# Patient Record
Sex: Female | Born: 1995 | Race: White | Hispanic: No | Marital: Single | Smoking: Never smoker
Health system: Southern US, Community
[De-identification: ages and names within clinical notes are randomized; demographics above are authoritative.]

## PROBLEM LIST (undated history)

## (undated) DIAGNOSIS — F3281 Premenstrual dysphoric disorder: Secondary | ICD-10-CM

## (undated) DIAGNOSIS — E282 Polycystic ovarian syndrome: Secondary | ICD-10-CM

## (undated) DIAGNOSIS — B3731 Acute candidiasis of vulva and vagina: Secondary | ICD-10-CM

## (undated) DIAGNOSIS — B9689 Other specified bacterial agents as the cause of diseases classified elsewhere: Secondary | ICD-10-CM

## (undated) DIAGNOSIS — N760 Acute vaginitis: Secondary | ICD-10-CM

## (undated) DIAGNOSIS — N912 Amenorrhea, unspecified: Secondary | ICD-10-CM

## (undated) DIAGNOSIS — R5383 Other fatigue: Secondary | ICD-10-CM

## (undated) DIAGNOSIS — E221 Hyperprolactinemia: Secondary | ICD-10-CM

## (undated) DIAGNOSIS — R011 Cardiac murmur, unspecified: Secondary | ICD-10-CM

## (undated) DIAGNOSIS — J45909 Unspecified asthma, uncomplicated: Secondary | ICD-10-CM

## (undated) HISTORY — PX: ESOPHAGOGASTRODUODENOSCOPY ENDOSCOPY: SHX5814

## (undated) HISTORY — PX: WISDOM TOOTH EXTRACTION: SHX21

## (undated) HISTORY — PX: TONSILLECTOMY: SHX5217

## (undated) HISTORY — DX: Unspecified asthma, uncomplicated: J45.909

## (undated) HISTORY — DX: Cardiac murmur, unspecified: R01.1

---
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
## 2016-02-28 ENCOUNTER — Other Ambulatory Visit: Payer: Self-pay | Admitting: Family Medicine

## 2016-02-28 MED ORDER — ALBUTEROL SULFATE HFA 108 (90 BASE) MCG/ACT IN AERS: 2.0000 | INHALATION_SPRAY | Freq: Four times a day (QID) | RESPIRATORY_TRACT | Status: DC | PRN

## 2016-02-28 NOTE — Telephone Encounter (Signed)
Pt's mother called stated pt needs a new rescue inhaler (albuterol). Pharm is [facility] in [place]. Thanks.

## 2016-02-28 NOTE — Telephone Encounter (Signed)
Routing to provider  

## 2016-02-28 NOTE — Telephone Encounter (Signed)
Rx sent to her pharmacy 

## 2016-03-12 ENCOUNTER — Emergency Department
Admission: EM | Admit: 2016-03-12 | Discharge: 2016-03-12 | Disposition: A | Discharge status: Home / Self Care | Payer: BLUE CROSS/BLUE SHIELD | Attending: Emergency Medicine | Admitting: Emergency Medicine

## 2016-03-12 ENCOUNTER — Encounter: Payer: Self-pay | Admitting: Emergency Medicine

## 2016-03-12 DIAGNOSIS — Z5321 Procedure and treatment not carried out due to patient leaving prior to being seen by health care provider: Secondary | ICD-10-CM | POA: Insufficient documentation

## 2016-03-12 DIAGNOSIS — R55 Syncope and collapse: Secondary | ICD-10-CM | POA: Insufficient documentation

## 2016-03-12 LAB — BASIC METABOLIC PANEL
ANION GAP: 6 (ref 5–15)
BUN: 14 mg/dL (ref 6–20)
CALCIUM: 9.1 mg/dL (ref 8.9–10.3)
CO2: 26 mmol/L (ref 22–32)
CREATININE: 0.79 mg/dL (ref 0.44–1.00)
Chloride: 106 mmol/L (ref 101–111)
Glucose, Bld: 77 mg/dL (ref 65–99)
Potassium: 4 mmol/L (ref 3.5–5.1)
SODIUM: 138 mmol/L (ref 135–145)

## 2016-03-12 LAB — CBC
HCT: 34.8 % — ABNORMAL LOW (ref 35.0–47.0)
HEMOGLOBIN: 12 g/dL (ref 12.0–16.0)
MCH: 29.4 pg (ref 26.0–34.0)
MCHC: 34.6 g/dL (ref 32.0–36.0)
MCV: 85 fL (ref 80.0–100.0)
PLATELETS: 161 10*3/uL (ref 150–440)
RBC: 4.09 MIL/uL (ref 3.80–5.20)
RDW: 15 % — ABNORMAL HIGH (ref 11.5–14.5)
WBC: 5.5 10*3/uL (ref 3.6–11.0)

## 2016-03-12 NOTE — ED Notes (Signed)
States was outside gardening this morning and felt light headed.  States went inside and felt as if she was going to pass out and had some chest tightness and wheezing.  Also c/o left arm numbness.

## 2016-03-16 ENCOUNTER — Encounter: Payer: Self-pay | Admitting: Family Medicine

## 2016-03-16 ENCOUNTER — Ambulatory Visit (INDEPENDENT_AMBULATORY_CARE_PROVIDER_SITE_OTHER): Payer: BLUE CROSS/BLUE SHIELD | Admitting: Family Medicine

## 2016-03-16 VITALS — BP 115/77 | HR 75 | Temp 98.2°F | Wt 157.0 lb

## 2016-03-16 DIAGNOSIS — J4531 Mild persistent asthma with (acute) exacerbation: Secondary | ICD-10-CM | POA: Diagnosis not present

## 2016-03-16 MED ORDER — LORATADINE 10 MG PO TABS: 10.0000 mg | ORAL_TABLET | Freq: Every day | ORAL | 11 refills | Status: DC

## 2016-03-16 MED ORDER — BUDESONIDE-FORMOTEROL FUMARATE 160-4.5 MCG/ACT IN AERO: 2.0000 | INHALATION_SPRAY | Freq: Two times a day (BID) | RESPIRATORY_TRACT | 3 refills | Status: DC

## 2016-03-16 NOTE — Progress Notes (Signed)
BP 115/77   Pulse 75   Temp 98.2 F (36.8 C)   Wt 157 lb (71.2 kg)   LMP 03/01/2016 (Exact Date)   SpO2 99%   BMI 24.59 kg/m    Subjective:    Patient ID: Jessica Harris, female    DOB: 1995-09-02, 20 y.o.   MRN: [id number]  HPI: Jessica Harris is a 20 y.o. female  Chief Complaint  Patient presents with  . URI    x few days; some chest tightness and SOB, cough. She did go to the ED for the chest pain/tightness. Work up was normal.   . Asthma    few days ago she had a really bad asthma attack.    20 year old female with hx of asthma presents following an ER visit for chest tightness, wheezing, and SOB that occurred while gardening a few days ago. Had some left arm numbness/tingling during episode, which alarmed her and she went to ER. Began to feel better before seeing provider, so left shortly after having a few labs drawn. States using her albuterol inhaler seemed to resolve symptoms.   Asthma worsening over the past year. Used to only have to use inhaler a few times per year, now having SOB almost daily the past few months. Worst in springtime. Takes claritin occasionally for allergy sxs.    Relevant past medical, surgical, family and social history reviewed and updated as indicated. Interim medical history since our last visit reviewed. Allergies and medications reviewed and updated.  Review of Systems  Constitutional: Negative.   HENT: Negative.   Respiratory: Positive for chest tightness, shortness of breath and wheezing.   Gastrointestinal: Negative.   Genitourinary: Negative.   Musculoskeletal: Negative.   Neurological: Positive for numbness.  Psychiatric/Behavioral: Negative.     Per HPI unless specifically indicated above     Objective:    BP 115/77   Pulse 75   Temp 98.2 F (36.8 C)   Wt 157 lb (71.2 kg)   LMP 03/01/2016 (Exact Date)   SpO2 99%   BMI 24.59 kg/m   Wt Readings from Last 3 Encounters:  03/16/16 157 lb (71.2 kg) (87 %, Z= 1.11)*    03/12/16 155 lb (70.3 kg) (86 %, Z= 1.06)*  05/27/15 160 lb (72.6 kg) (90 %, Z= 1.26)*   * Growth percentiles are based on CDC 2-20 Years data.    Physical Exam  Constitutional: She is oriented to person, place, and time. She appears well-developed and well-nourished.  HENT:  Head: Atraumatic.  Mouth/Throat: Oropharynx is clear and moist.  Eyes: Conjunctivae are normal. No scleral icterus.  Neck: Normal range of motion. Neck supple.  Cardiovascular: Normal rate.   Murmur heard. Pulmonary/Chest: Effort normal and breath sounds normal. She has no wheezes.  Musculoskeletal: Normal range of motion.  Lymphadenopathy:    She has no cervical adenopathy.  Neurological: She is alert and oriented to person, place, and time.  Skin: Skin is warm and dry.  Psychiatric: She has a normal mood and affect. Her behavior is normal.  Nursing note and vitals reviewed.   Results for orders placed or performed during the hospital encounter of 03/12/16  Basic metabolic panel  Result Value Ref Range   Sodium 138 135 - 145 mmol/L   Potassium 4.0 3.5 - 5.1 mmol/L   Chloride 106 101 - 111 mmol/L   CO2 26 22 - 32 mmol/L   Glucose, Bld 77 65 - 99 mg/dL   BUN 14 6 - 20  mg/dL   Creatinine, Ser 1.61 0.44 - 1.00 mg/dL   Calcium 9.1 8.9 - 09.6 mg/dL   GFR calc non Af Amer >60 >60 mL/min   GFR calc Af Amer >60 >60 mL/min   Anion gap 6 5 - 15  CBC  Result Value Ref Range   WBC 5.5 3.6 - 11.0 K/uL   RBC 4.09 3.80 - 5.20 MIL/uL   Hemoglobin 12.0 12.0 - 16.0 g/dL   HCT 04.5 (L) 40.9 - 81.1 %   MCV 85.0 80.0 - 100.0 fL   MCH 29.4 26.0 - 34.0 pg   MCHC 34.6 32.0 - 36.0 g/dL   RDW 91.4 (H) 78.2 - 95.6 %   Platelets 161 150 - 440 K/uL      Assessment & Plan:   Problem List Items Addressed This Visit    None    Visit Diagnoses    Asthma, mild persistent, with acute exacerbation    -  Primary   Relevant Medications   budesonide-formoterol (SYMBICORT) 160-4.5 MCG/ACT inhaler     Will add symbicort  daily to her regimen as her symptoms are now persistent. Will watch for weight gain, as patient states long ago while on flovent that she had a fair amount of weight gain. Discussed swishing water after use of inhaler. Continue albuterol inhaler as needed.   Discussed taking claritin daily rather than on occasion, especially during spring. Rx sent for generic as they were concerned with OTC cost.    Follow up plan: Return if symptoms worsen or fail to improve.

## 2016-03-16 NOTE — Patient Instructions (Signed)
Follow up as needed

## 2016-03-24 NOTE — ED Provider Notes (Signed)
Time Seen: Approximately 12:30 I have reviewed the triage notes  Chief Complaint: Near Syncope   History of Present Illness: Jessica Harris is a 20 y.o. female *who states she was working outside when she felt very lightheaded and had some mild chest discomfort. She denies any syncopal episode. Patient denies any nausea, vomiting, shortness of breath and feels symptomatically improved at this time. She has history of a heart murmur and asthma   Past Medical History:  Diagnosis Date  . Asthma   . Heart murmur     Patient Active Problem List   Diagnosis Date Noted  . History of asthma 03/01/2015  . Anemia 02/08/2015  . Heart murmur 06/04/2013  . Chest wall pain 06/04/2013    Past Surgical History:  Procedure Laterality Date  . ESOPHAGOGASTRODUODENOSCOPY ENDOSCOPY     2x normal after peristent vomiting  . TONSILLECTOMY      Past Surgical History:  Procedure Laterality Date  . ESOPHAGOGASTRODUODENOSCOPY ENDOSCOPY     2x normal after peristent vomiting  . TONSILLECTOMY      Current Outpatient Rx  . Order #: [id number] Class: Normal  . Order #: [id number] Class: Normal  . Order #: [id number] Class: Normal  . Order #: [id number] Class: Normal  . Order #: [id number] Class: Historical Med  . Order #: [id number] Class: Normal  . Order #: [id number] Class: Normal  . Order #: [id number] Class: Normal  . Order #: [id number] Class: Historical Med    Allergies:  Review of patient's allergies indicates no known allergies.  Family History: Family History  Problem Relation Age of Onset  . Diabetes Mother     type 1  . Hematuria Mother   . Esophagitis Brother     eosinophilic esophagitis  . Cancer Maternal Grandmother     breast  . Diabetes Maternal Grandmother   . Addison's disease Maternal Grandmother   . Fibromyalgia Maternal Grandmother   . Heart disease Maternal Grandmother   . Stroke Maternal Grandmother   . Heart attack Maternal Grandfather   . Cancer Maternal Grandfather    prostate  . Hematuria Maternal Grandfather   . Cancer Paternal Grandmother     skin  . Heart attack Paternal Grandfather     Social History: Social History  Substance Use Topics  . Smoking status: Never Smoker  . Smokeless tobacco: Never Used  . Alcohol use No     Review of Systems:   10 point review of systems was performed and was otherwise negative:  Constitutional: No fever Eyes: No visual disturbances ENT: No sore throat, ear pain Cardiac: No current chest pain Respiratory: No shortness of breath, wheezing, or stridor Abdomen: No abdominal pain, no vomiting, No diarrhea Endocrine: No weight loss, No night sweats Extremities: No peripheral edema, cyanosis Skin: No rashes, easy bruising Neurologic: No focal weakness, trouble with speech or swollowing Urologic: No dysuria, Hematuria, or urinary frequency   Physical Exam:  ED Triage Vitals  Enc Vitals Group     BP 03/12/16 1152 116/72     Pulse Rate 03/12/16 1152 89     Resp 03/12/16 1152 18     Temp 03/12/16 1152 97.9 F (36.6 C)     Temp src --      SpO2 03/12/16 1152 100 %     Weight 03/12/16 1152 155 lb (70.3 kg)     Height 03/12/16 1152 5\' 7"  (1.702 m)     Head Circumference --      Peak Flow --      Pain Score 03/12/16 1153  4     Pain Loc --      Pain Edu? --      Excl. in GC? --     General: Awake , Alert , and Oriented times 3; GCS 15 Head: Normal cephalic , atraumatic Eyes: Pupils equal , round, reactive to light Nose/Throat: No nasal drainage, patent upper airway without erythema or exudate.  Neck: Supple, Full range of motion, No anterior adenopathy or palpable thyroid masses Lungs: Clear to ascultation without wheezes , rhonchi, or rales Heart: Regular rate, regular rhythm without murmurs , gallops , or rubs Abdomen: Soft, non tender without rebound, guarding , or rigidity; bowel sounds positive and symmetric in all 4 quadrants. No organomegaly .        Extremities: 2 plus symmetric pulses.  No edema, clubbing or cyanosis Neurologic: normal ambulation, Motor symmetric without deficits, sensory intact Skin: warm, dry, no rashes   Labs:   All laboratory work was reviewed including any pertinent negatives or positives listed below:  Labs Reviewed  CBC - Abnormal; Notable for the following:       Result Value   HCT 34.8 (*)    RDW 15.0 (*)    All other components within normal limits  BASIC METABOLIC PANEL  Laboratory work was reviewed and showed no clinically significant abnormalities.   EKG:  ED ECG REPORT I, Jennye Moccasin, the attending physician, personally viewed and interpreted this ECG.  Date: 03/24/2016 EKG Time: 1140 Rate: 60 Rhythm: normal sinus rhythm QRS Axis: normal Intervals: normal ST/T Wave abnormalities: normal Conduction Disturbances: none Narrative Interpretation: unremarkable Normal    ED Course:  Patient's stay here was uneventful she had some IV fluids etc. I felt this was unlikely to be a life-threatening cause for chest pain the patient was discharged in stable improved condition  Clinical Course     Assessment:  Near syncope     Plan:  Outpatient management Patient was advised to return immediately if condition worsens. Patient was advised to follow up with their primary care physician or other specialized physicians involved in their outpatient care. The patient and/or family member/power of attorney had laboratory results reviewed at the bedside. All questions and concerns were addressed and appropriate discharge instructions were distributed by the nursing staff.             Jennye Moccasin, MD 03/24/16 [phone]

## 2016-05-31 DIAGNOSIS — J029 Acute pharyngitis, unspecified: Secondary | ICD-10-CM | POA: Diagnosis not present

## 2016-05-31 DIAGNOSIS — J3089 Other allergic rhinitis: Secondary | ICD-10-CM | POA: Diagnosis not present

## 2016-06-01 ENCOUNTER — Other Ambulatory Visit: Payer: Self-pay

## 2016-06-01 ENCOUNTER — Encounter: Payer: Self-pay | Admitting: Family Medicine

## 2016-06-01 ENCOUNTER — Ambulatory Visit (INDEPENDENT_AMBULATORY_CARE_PROVIDER_SITE_OTHER): Payer: BLUE CROSS/BLUE SHIELD | Admitting: Family Medicine

## 2016-06-01 VITALS — BP 104/74 | HR 92 | Temp 99.3°F | Wt 158.0 lb

## 2016-06-01 DIAGNOSIS — B9789 Other viral agents as the cause of diseases classified elsewhere: Secondary | ICD-10-CM | POA: Diagnosis not present

## 2016-06-01 DIAGNOSIS — J069 Acute upper respiratory infection, unspecified: Secondary | ICD-10-CM

## 2016-06-01 MED ORDER — LIDOCAINE VISCOUS 2 % MT SOLN: 5.0000 mL | OROMUCOSAL | 0 refills | Status: DC | PRN

## 2016-06-01 MED ORDER — FLUTICASONE PROPIONATE 50 MCG/ACT NA SUSP: 2.0000 | Freq: Every day | NASAL | 6 refills | Status: DC

## 2016-06-01 NOTE — Progress Notes (Signed)
BP 104/74   Pulse 92   Temp 99.3 F (37.4 C)   Wt 158 lb (71.7 kg)   LMP 05/13/2016 (Exact Date)   SpO2 100%   BMI 24.75 kg/m    Subjective:    Patient ID: Jessica Harris, female    DOB: [birth year], 20 y.o.   MRN: [id number]  HPI: Jessica Harris is a 20 y.o. female  Chief Complaint  Patient presents with  . URI    x 3 days. Sore Throat. Congestion, runny nose, fever, ear pain. No cough. Went to urgent care yesterday and had rapid strep test, was negative. Woke up this morning feeling worse.   Patient presents with 3 day history of sore throat, nasal congestion, and right ear pain. Started running low grade fever this morning. Went to UC yesterday and was tested for strep, which was negative. Took some tylenol about 9 am this morning. Taking benadryl and claritin with no relief. Her younger brother has a rare condition where he is immune compromised so she is concerned about what is causing her symptoms.   Relevant past medical, surgical, family and social history reviewed and updated as indicated. Interim medical history since our last visit reviewed. Allergies and medications reviewed and updated.  Review of Systems  Constitutional: Positive for fever.  HENT: Positive for congestion, ear pain, postnasal drip, rhinorrhea and sore throat.   Eyes: Negative.   Respiratory: Negative.   Cardiovascular: Negative.   Gastrointestinal: Negative.   Genitourinary: Negative.   Musculoskeletal: Negative.   Skin: Negative.   Neurological: Negative.   Psychiatric/Behavioral: Negative.     Per HPI unless specifically indicated above     Objective:    BP 104/74   Pulse 92   Temp 99.3 F (37.4 C)   Wt 158 lb (71.7 kg)   LMP 05/13/2016 (Exact Date)   SpO2 100%   BMI 24.75 kg/m   Wt Readings from Last 3 Encounters:  06/01/16 158 lb (71.7 kg) (87 %, Z= 1.12)*  03/16/16 157 lb (71.2 kg) (87 %, Z= 1.11)*  03/12/16 155 lb (70.3 kg) (86 %, Z= 1.06)*   * Growth percentiles are based  on CDC 2-20 Years data.    Physical Exam  Constitutional: She is oriented to person, place, and time. She appears well-developed and well-nourished. No distress.  HENT:  Head: Atraumatic.  Right Ear: External ear normal.  Left Ear: External ear normal.  Nose: Nose normal.  Mouth/Throat: Oropharynx is clear and moist. No oropharyngeal exudate.  Eyes: Conjunctivae are normal. Pupils are equal, round, and reactive to light. No scleral icterus.  Neck: Normal range of motion. Neck supple.  Cardiovascular: Normal rate.   Pulmonary/Chest: Effort normal and breath sounds normal. No respiratory distress. She has no wheezes. She has no rales.  Musculoskeletal: Normal range of motion.  Lymphadenopathy:    She has no cervical adenopathy.  Neurological: She is alert and oriented to person, place, and time.  Skin: Skin is warm and dry. No rash noted. No erythema.  Psychiatric: She has a normal mood and affect. Her behavior is normal.  Nursing note and vitals reviewed.     Assessment & Plan:   Problem List Items Addressed This Visit    None    Visit Diagnoses    Viral upper respiratory tract infection    -  Primary   Viscous lidocaine and flonase sent to help treat symptoms. Discussed good hand hygeine and separation to help protect little brother. Follow up if no  resolution    Recommended salt water gargles, warm tea and honey, continued tylenol or ibuprofen for pain and fever. Good hydration, rest. Work note given   Follow up plan: Return if symptoms worsen or fail to improve.

## 2016-06-01 NOTE — Patient Instructions (Signed)
Follow up as needed. Can take mucinex DM to help with congestion symptoms.

## 2016-06-03 ENCOUNTER — Other Ambulatory Visit: Payer: Self-pay | Admitting: Family Medicine

## 2016-06-03 ENCOUNTER — Telehealth: Payer: Self-pay | Admitting: Family Medicine

## 2016-06-03 MED ORDER — BENZONATATE 200 MG PO CAPS
200.0000 mg | ORAL_CAPSULE | Freq: Two times a day (BID) | ORAL | 0 refills | Status: DC | PRN
Start: 2016-06-03 — End: 2016-12-04

## 2016-06-03 MED ORDER — HYDROCOD POLST-CPM POLST ER 10-8 MG/5ML PO SUER: 5.0000 mL | Freq: Two times a day (BID) | ORAL | 0 refills | Status: DC | PRN

## 2016-06-03 NOTE — Telephone Encounter (Signed)
Routing to provider  

## 2016-06-03 NOTE — Telephone Encounter (Signed)
Pts mom called and stated that the pt was doing a lot of coughing and would like to have something called in to [facility].

## 2016-06-03 NOTE — Telephone Encounter (Signed)
Tessalon perles sent to [facility]

## 2016-08-07 ENCOUNTER — Other Ambulatory Visit: Payer: Self-pay | Admitting: Family Medicine

## 2016-08-19 ENCOUNTER — Other Ambulatory Visit: Payer: Self-pay | Admitting: Family Medicine

## 2016-08-19 NOTE — Telephone Encounter (Signed)
Last refilled on 07/16/15

## 2016-12-04 ENCOUNTER — Encounter: Payer: Self-pay | Admitting: Family Medicine

## 2016-12-04 ENCOUNTER — Ambulatory Visit (INDEPENDENT_AMBULATORY_CARE_PROVIDER_SITE_OTHER): Payer: BLUE CROSS/BLUE SHIELD | Admitting: Family Medicine

## 2016-12-04 VITALS — BP 129/79 | HR 91 | Temp 98.3°F | Ht 66.1 in | Wt 159.5 lb

## 2016-12-04 DIAGNOSIS — Z0000 Encounter for general adult medical examination without abnormal findings: Secondary | ICD-10-CM

## 2016-12-04 LAB — MICROSCOPIC EXAMINATION
Bacteria, UA: NONE SEEN
RBC MICROSCOPIC, UA: NONE SEEN /HPF (ref 0–?)

## 2016-12-04 LAB — UA/M W/RFLX CULTURE, ROUTINE
Bilirubin, UA: NEGATIVE
GLUCOSE, UA: NEGATIVE
KETONES UA: NEGATIVE
NITRITE UA: NEGATIVE
Protein, UA: NEGATIVE
RBC UA: NEGATIVE
SPEC GRAV UA: 1.015 (ref 1.005–1.030)
UUROB: 0.2 mg/dL (ref 0.2–1.0)
pH, UA: 6.5 (ref 5.0–7.5)

## 2016-12-04 NOTE — Patient Instructions (Addendum)
Health Maintenance, Female Adopting a healthy lifestyle and getting preventive care can go a long way to promote health and wellness. Talk with your health care provider about what schedule of regular examinations is right for you. This is a good chance for you to check in with your provider about disease prevention and staying healthy. In between checkups, there are plenty of things you can do on your own. Experts have done a lot of research about which lifestyle changes and preventive measures are most likely to keep you healthy. Ask your health care provider for more information. Weight and diet Eat a healthy diet  Be sure to include plenty of vegetables, fruits, low-fat dairy products, and lean protein.  Do not eat a lot of foods high in solid fats, added sugars, or salt.  Get regular exercise. This is one of the most important things you can do for your health.  Most adults should exercise for at least 150 minutes each week. The exercise should increase your heart rate and make you sweat (moderate-intensity exercise).  Most adults should also do strengthening exercises at least twice a week. This is in addition to the moderate-intensity exercise. Maintain a healthy weight  Body mass index (BMI) is a measurement that can be used to identify possible weight problems. It estimates body fat based on height and weight. Your health care provider can help determine your BMI and help you achieve or maintain a healthy weight.  For females 76 years of age and older:  A BMI below 18.5 is considered underweight.  A BMI of 18.5 to 24.9 is normal.  A BMI of 25 to 29.9 is considered overweight.  A BMI of 30 and above is considered obese. Watch levels of cholesterol and blood lipids  You should start having your blood tested for lipids and cholesterol at 20 years of age, then have this test every 5 years.  You may need to have your cholesterol levels checked more often if:  Your lipid or  cholesterol levels are high.  You are older than 20 years of age.  You are at high risk for heart disease. Cancer screening Lung Cancer  Lung cancer screening is recommended for adults 64-42 years old who are at high risk for lung cancer because of a history of smoking.  A yearly low-dose CT scan of the lungs is recommended for people who:  Currently smoke.  Have quit within the past 15 years.  Have at least a 30-pack-year history of smoking. A pack year is smoking an average of one pack of cigarettes a day for 1 year.  Yearly screening should continue until it has been 15 years since you quit.  Yearly screening should stop if you develop a health problem that would prevent you from having lung cancer treatment. Breast Cancer  Practice breast self-awareness. This means understanding how your breasts normally appear and feel.  It also means doing regular breast self-exams. Let your health care provider know about any changes, no matter how small.  If you are in your 20s or 30s, you should have a clinical breast exam (CBE) by a health care provider every 1-3 years as part of a regular health exam.  If you are 34 or older, have a CBE every year. Also consider having a breast X-ray (mammogram) every year.  If you have a family history of breast cancer, talk to your health care provider about genetic screening.  If you are at high risk for breast cancer, talk  to your health care provider about having an MRI and a mammogram every year.  Breast cancer gene (BRCA) assessment is recommended for women who have family members with BRCA-related cancers. BRCA-related cancers include:  Breast.  Ovarian.  Tubal.  Peritoneal cancers.  Results of the assessment will determine the need for genetic counseling and BRCA1 and BRCA2 testing. Cervical Cancer  Your health care provider may recommend that you be screened regularly for cancer of the pelvic organs (ovaries, uterus, and vagina).  This screening involves a pelvic examination, including checking for microscopic changes to the surface of your cervix (Pap test). You may be encouraged to have this screening done every 3 years, beginning at age 24.  For women ages 66-65, health care providers may recommend pelvic exams and Pap testing every 3 years, or they may recommend the Pap and pelvic exam, combined with testing for human papilloma virus (HPV), every 5 years. Some types of HPV increase your risk of cervical cancer. Testing for HPV may also be done on women of any age with unclear Pap test results.  Other health care providers may not recommend any screening for nonpregnant women who are considered low risk for pelvic cancer and who do not have symptoms. Ask your health care provider if a screening pelvic exam is right for you.  If you have had past treatment for cervical cancer or a condition that could lead to cancer, you need Pap tests and screening for cancer for at least 20 years after your treatment. If Pap tests have been discontinued, your risk factors (such as having a new sexual partner) need to be reassessed to determine if screening should resume. Some women have medical problems that increase the chance of getting cervical cancer. In these cases, your health care provider may recommend more frequent screening and Pap tests. Colorectal Cancer  This type of cancer can be detected and often prevented.  Routine colorectal cancer screening usually begins at 20 years of age and continues through 20 years of age.  Your health care provider may recommend screening at an earlier age if you have risk factors for colon cancer.  Your health care provider may also recommend using home test kits to check for hidden blood in the stool.  A small camera at the end of a tube can be used to examine your colon directly (sigmoidoscopy or colonoscopy). This is done to check for the earliest forms of colorectal cancer.  Routine  screening usually begins at age 41.  Direct examination of the colon should be repeated every 5-10 years through 20 years of age. However, you may need to be screened more often if early forms of precancerous polyps or small growths are found. Skin Cancer  Check your skin from head to toe regularly.  Tell your health care provider about any new moles or changes in moles, especially if there is a change in a mole's shape or color.  Also tell your health care provider if you have a mole that is larger than the size of a pencil eraser.  Always use sunscreen. Apply sunscreen liberally and repeatedly throughout the day.  Protect yourself by wearing long sleeves, pants, a wide-brimmed hat, and sunglasses whenever you are outside. Heart disease, diabetes, and high blood pressure  High blood pressure causes heart disease and increases the risk of stroke. High blood pressure is more likely to develop in:  People who have blood pressure in the high end of the normal range (130-139/85-89 mm Hg).  People who are overweight or obese.  People who are African American.  If you are 59-24 years of age, have your blood pressure checked every 3-5 years. If you are 34 years of age or older, have your blood pressure checked every year. You should have your blood pressure measured twice-once when you are at a hospital or clinic, and once when you are not at a hospital or clinic. Record the average of the two measurements. To check your blood pressure when you are not at a hospital or clinic, you can use:  An automated blood pressure machine at a pharmacy.  A home blood pressure monitor.  If you are between 29 years and 60 years old, ask your health care provider if you should take aspirin to prevent strokes.  Have regular diabetes screenings. This involves taking a blood sample to check your fasting blood sugar level.  If you are at a normal weight and have a low risk for diabetes, have this test once  every three years after 20 years of age.  If you are overweight and have a high risk for diabetes, consider being tested at a younger age or more often. Preventing infection Hepatitis B  If you have a higher risk for hepatitis B, you should be screened for this virus. You are considered at high risk for hepatitis B if:  You were born in a country where hepatitis B is common. Ask your health care provider which countries are considered high risk.  Your parents were born in a high-risk country, and you have not been immunized against hepatitis B (hepatitis B vaccine).  You have HIV or AIDS.  You use needles to inject street drugs.  You live with someone who has hepatitis B.  You have had sex with someone who has hepatitis B.  You get hemodialysis treatment.  You take certain medicines for conditions, including cancer, organ transplantation, and autoimmune conditions. Hepatitis C  Blood testing is recommended for:  Everyone born from [birth year] through 1964.  Anyone with known risk factors for hepatitis C. Sexually transmitted infections (STIs)  You should be screened for sexually transmitted infections (STIs) including gonorrhea and chlamydia if:  You are sexually active and are younger than 20 years of age.  You are older than 20 years of age and your health care provider tells you that you are at risk for this type of infection.  Your sexual activity has changed since you were last screened and you are at an increased risk for chlamydia or gonorrhea. Ask your health care provider if you are at risk.  If you do not have HIV, but are at risk, it may be recommended that you take a prescription medicine daily to prevent HIV infection. This is called pre-exposure prophylaxis (PrEP). You are considered at risk if:  You are sexually active and do not regularly use condoms or know the HIV status of your partner(s).  You take drugs by injection.  You are sexually active with a partner  who has HIV. Talk with your health care provider about whether you are at high risk of being infected with HIV. If you choose to begin PrEP, you should first be tested for HIV. You should then be tested every 3 months for as long as you are taking PrEP. Pregnancy  If you are premenopausal and you may become pregnant, ask your health care provider about preconception counseling.  If you may become pregnant, take 400 to 800 micrograms (mcg) of folic acid  every day.  If you want to prevent pregnancy, talk to your health care provider about birth control (contraception). Osteoporosis and menopause  Osteoporosis is a disease in which the bones lose minerals and strength with aging. This can result in serious bone fractures. Your risk for osteoporosis can be identified using a bone density scan.  If you are 4 years of age or older, or if you are at risk for osteoporosis and fractures, ask your health care provider if you should be screened.  Ask your health care provider whether you should take a calcium or vitamin D supplement to lower your risk for osteoporosis.  Menopause may have certain physical symptoms and risks.  Hormone replacement therapy may reduce some of these symptoms and risks. Talk to your health care provider about whether hormone replacement therapy is right for you. Follow these instructions at home:  Schedule regular health, dental, and eye exams.  Stay current with your immunizations.  Do not use any tobacco products including cigarettes, chewing tobacco, or electronic cigarettes.  If you are pregnant, do not drink alcohol.  If you are breastfeeding, limit how much and how often you drink alcohol.  Limit alcohol intake to no more than 1 drink per day for nonpregnant women. One drink equals 12 ounces of beer, 5 ounces of wine, or 1 ounces of hard liquor.  Do not use street drugs.  Do not share needles.  Ask your health care provider for help if you need support  or information about quitting drugs.  Tell your health care provider if you often feel depressed.  Tell your health care provider if you have ever been abused or do not feel safe at home. This information is not intended to replace advice given to you by your health care provider. Make sure you discuss any questions you have with your health care provider. Document Released: 02/23/2011 Document Revised: 01/16/2016 Document Reviewed: 05/14/2015 Elsevier Interactive Patient Education  2017 [employer].

## 2016-12-04 NOTE — Progress Notes (Signed)
BP 129/79 (BP Location: Left Arm, Patient Position: Sitting, Cuff Size: Normal)   Pulse 91   Temp 98.3 F (36.8 C)   Ht 5' 6.1" (1.679 m)   Wt 159 lb 8 oz (72.3 kg)   LMP 11/02/2016 (Exact Date)   SpO2 95%   BMI 25.67 kg/m    Subjective:    Patient ID: Jessica Harris, female    DOB: Dec 26, 1995, 21 y.o.   MRN: [id number]  HPI: Jessica Harris is a 21 y.o. female presenting on 12/04/2016 for comprehensive medical examination. Current medical complaints include:  ASTHMA- not bothering her now, seems to have resolved. Not on medicine.  She currently lives with: alone Menopausal Symptoms: no  Depression Screen done today and results listed below:  Depression screen [facility] 2/9 12/04/2016  Decreased Interest 0  Down, Depressed, Hopeless 0  PHQ - 2 Score 0    Past Medical History:  Past Medical History:  Diagnosis Date  . Asthma   . Heart murmur     Surgical History:  Past Surgical History:  Procedure Laterality Date  . ESOPHAGOGASTRODUODENOSCOPY ENDOSCOPY     2x normal after peristent vomiting  . TONSILLECTOMY    . WISDOM TOOTH EXTRACTION      Medications:  Current Outpatient Prescriptions on File Prior to Visit  Medication Sig  . Meth-Hyo-M Bl-Na Phos-Ph Sal (URO-MP) 118 MG CAPS TAKE ONE CAPSULE UP TO FOUR TIMES DAILY WITH LARGE GLASS OF WATER  . ibuprofen (ADVIL,MOTRIN) 200 MG tablet Take 200 mg by mouth every 6 (six) hours as needed.   No current facility-administered medications on file prior to visit.     Allergies:  No Known Allergies  Social History:  Social History   Social History  . Marital status: Single    Spouse name: N/A  . Number of children: N/A  . Years of education: N/A   Occupational History  . Not on file.   Social History Main Topics  . Smoking status: Never Smoker  . Smokeless tobacco: Never Used  . Alcohol use No  . Drug use: No  . Sexual activity: No   Other Topics Concern  . Not on file   Social History Narrative  . No  narrative on file   History  Smoking Status  . Never Smoker  Smokeless Tobacco  . Never Used   History  Alcohol Use No    Family History:  Family History  Problem Relation Age of Onset  . Diabetes Mother     type 1  . Hematuria Mother   . Esophagitis Brother     eosinophilic esophagitis  . Cancer Maternal Grandmother     breast  . Diabetes Maternal Grandmother   . Addison's disease Maternal Grandmother   . Fibromyalgia Maternal Grandmother   . Heart disease Maternal Grandmother   . Stroke Maternal Grandmother   . Heart attack Maternal Grandfather   . Cancer Maternal Grandfather     prostate  . Hematuria Maternal Grandfather   . Cancer Paternal Grandmother     skin  . Heart attack Paternal Grandfather     Past medical history, surgical history, medications, allergies, family history and social history reviewed with patient today and changes made to appropriate areas of the chart.   Review of Systems  Constitutional: Negative.   HENT: Negative.   Eyes: Negative.   Respiratory: Negative.   Cardiovascular: Negative.   Gastrointestinal: Negative.   Genitourinary: Negative.   Musculoskeletal: Negative.   Skin: Negative.   Neurological:  Negative.   Endo/Heme/Allergies: Negative.   Psychiatric/Behavioral: Negative.     All other ROS negative except what is listed above and in the HPI.      Objective:    BP 129/79 (BP Location: Left Arm, Patient Position: Sitting, Cuff Size: Normal)   Pulse 91   Temp 98.3 F (36.8 C)   Ht 5' 6.1" (1.679 m)   Wt 159 lb 8 oz (72.3 kg)   LMP 11/02/2016 (Exact Date)   SpO2 95%   BMI 25.67 kg/m   Wt Readings from Last 3 Encounters:  12/04/16 159 lb 8 oz (72.3 kg) (87 %, Z= 1.12)*  06/01/16 158 lb (71.7 kg) (87 %, Z= 1.12)*  03/16/16 157 lb (71.2 kg) (87 %, Z= 1.11)*   * Growth percentiles are based on CDC 2-20 Years data.    Physical Exam  Constitutional: She is oriented to person, place, and time. She appears  well-developed and well-nourished. No distress.  HENT:  Head: Normocephalic and atraumatic.  Right Ear: Hearing and external ear normal.  Left Ear: Hearing and external ear normal.  Nose: Nose normal.  Mouth/Throat: Oropharynx is clear and moist. No oropharyngeal exudate.  Eyes: Conjunctivae, EOM and lids are normal. Pupils are equal, round, and reactive to light. Right eye exhibits no discharge. Left eye exhibits no discharge. No scleral icterus.  Neck: Normal range of motion. Neck supple. No JVD present. No tracheal deviation present. No thyromegaly present.  Cardiovascular: Normal rate, regular rhythm and intact distal pulses.  Exam reveals no gallop and no friction rub.   Murmur heard. Pulmonary/Chest: Effort normal and breath sounds normal. No stridor. No respiratory distress. She has no wheezes. She has no rales. She exhibits no tenderness. Right breast exhibits no inverted nipple, no mass, no nipple discharge, no skin change and no tenderness. Left breast exhibits no inverted nipple, no mass, no nipple discharge, no skin change and no tenderness. Breasts are symmetrical.  Abdominal: Soft. Bowel sounds are normal. She exhibits no distension and no mass. There is no tenderness. There is no rebound and no guarding.  Genitourinary:  Genitourinary Comments: Pelvic exam deferred with shared decision making  Musculoskeletal: Normal range of motion. She exhibits no edema, tenderness or deformity.  Lymphadenopathy:    She has cervical adenopathy.  Neurological: She is alert and oriented to person, place, and time. She has normal reflexes. She displays normal reflexes. No cranial nerve deficit. She exhibits normal muscle tone. Coordination normal.  Skin: Skin is warm, dry and intact. No rash noted. She is not diaphoretic. No erythema. No pallor.  Psychiatric: She has a normal mood and affect. Her speech is normal and behavior is normal. Judgment and thought content normal. Cognition and memory are  normal.  Nursing note and vitals reviewed.   Results for orders placed or performed during the hospital encounter of 03/12/16  Basic metabolic panel  Result Value Ref Range   Sodium 138 135 - 145 mmol/L   Potassium 4.0 3.5 - 5.1 mmol/L   Chloride 106 101 - 111 mmol/L   CO2 26 22 - 32 mmol/L   Glucose, Bld 77 65 - 99 mg/dL   BUN 14 6 - 20 mg/dL   Creatinine, Ser 5.28 0.44 - 1.00 mg/dL   Calcium 9.1 8.9 - 41.3 mg/dL   GFR calc non Af Amer >60 >60 mL/min   GFR calc Af Amer >60 >60 mL/min   Anion gap 6 5 - 15  CBC  Result Value Ref Range  WBC 5.5 3.6 - 11.0 K/uL   RBC 4.09 3.80 - 5.20 MIL/uL   Hemoglobin 12.0 12.0 - 16.0 g/dL   HCT 16.1 (L) 09.6 - 04.5 %   MCV 85.0 80.0 - 100.0 fL   MCH 29.4 26.0 - 34.0 pg   MCHC 34.6 32.0 - 36.0 g/dL   RDW 40.9 (H) 81.1 - 91.4 %   Platelets 161 150 - 440 K/uL      Assessment & Plan:   Problem List Items Addressed This Visit    None    Visit Diagnoses    Routine general medical examination at a health care facility    -  Primary   Vaccines up to date. Screening labs checked today. Continue diet and exercise. Call with any concerns.    Relevant Orders   CBC with Differential/Platelet   Comprehensive metabolic panel   Lipid Panel w/o Chol/HDL Ratio   TSH   UA/M w/rflx Culture, Routine       Follow up plan: Return in about 1 year (around 12/04/2017) for Physical.   LABORATORY TESTING:  - Pap smear: not applicable  IMMUNIZATIONS:   - Tdap: Tetanus vaccination status reviewed: last tetanus booster within 10 years. - Influenza: Postponed to flu season - Pneumovax: Refused  PATIENT COUNSELING:   Advised to take 1 mg of folate supplement per day if capable of pregnancy.   Sexuality: Discussed sexually transmitted diseases, partner selection, use of condoms, avoidance of unintended pregnancy  and contraceptive alternatives.   Advised to avoid cigarette smoking.  I discussed with the patient that most people either abstain from  alcohol or drink within safe limits (<=14/week and <=4 drinks/occasion for males, <=7/weeks and <= 3 drinks/occasion for females) and that the risk for alcohol disorders and other health effects rises proportionally with the number of drinks per week and how often a drinker exceeds daily limits.  Discussed cessation/primary prevention of drug use and availability of treatment for abuse.   Diet: Encouraged to adjust caloric intake to maintain  or achieve ideal body weight, to reduce intake of dietary saturated fat and total fat, to limit sodium intake by avoiding high sodium foods and not adding table salt, and to maintain adequate dietary potassium and calcium preferably from fresh fruits, vegetables, and low-fat dairy products.    stressed the importance of regular exercise  Injury prevention: Discussed safety belts, safety helmets, smoke detector, smoking near bedding or upholstery.   Dental health: Discussed importance of regular tooth brushing, flossing, and dental visits.    NEXT PREVENTATIVE PHYSICAL DUE IN 1 YEAR. Return in about 1 year (around 12/04/2017) for Physical.

## 2016-12-05 LAB — CBC WITH DIFFERENTIAL/PLATELET
Basophils Absolute: 0 10*3/uL (ref 0.0–0.2)
Basos: 1 %
EOS (ABSOLUTE): 0.1 10*3/uL (ref 0.0–0.4)
Eos: 2 %
HEMATOCRIT: 34.7 % (ref 34.0–46.6)
HEMOGLOBIN: 11.5 g/dL (ref 11.1–15.9)
Immature Grans (Abs): 0 10*3/uL (ref 0.0–0.1)
Immature Granulocytes: 0 %
LYMPHS ABS: 1.2 10*3/uL (ref 0.7–3.1)
Lymphs: 27 %
MCH: 26.8 pg (ref 26.6–33.0)
MCHC: 33.1 g/dL (ref 31.5–35.7)
MCV: 81 fL (ref 79–97)
MONOCYTES: 10 %
Monocytes Absolute: 0.5 10*3/uL (ref 0.1–0.9)
NEUTROS ABS: 2.8 10*3/uL (ref 1.4–7.0)
Neutrophils: 60 %
Platelets: 209 10*3/uL (ref 150–379)
RBC: 4.29 x10E6/uL (ref 3.77–5.28)
RDW: 16.1 % — ABNORMAL HIGH (ref 12.3–15.4)
WBC: 4.7 10*3/uL (ref 3.4–10.8)

## 2016-12-05 LAB — COMPREHENSIVE METABOLIC PANEL
ALBUMIN: 4.6 g/dL (ref 3.5–5.5)
ALK PHOS: 66 IU/L (ref 39–117)
ALT: 15 IU/L (ref 0–32)
AST: 17 IU/L (ref 0–40)
Albumin/Globulin Ratio: 1.9 (ref 1.2–2.2)
BILIRUBIN TOTAL: 0.4 mg/dL (ref 0.0–1.2)
BUN / CREAT RATIO: 19 (ref 9–23)
BUN: 14 mg/dL (ref 6–20)
CO2: 28 mmol/L (ref 18–29)
CREATININE: 0.73 mg/dL (ref 0.57–1.00)
Calcium: 9.6 mg/dL (ref 8.7–10.2)
Chloride: 99 mmol/L (ref 96–106)
GFR calc Af Amer: 138 mL/min/{1.73_m2} (ref >59)
GFR calc non Af Amer: 120 mL/min/{1.73_m2} (ref >59)
GLOBULIN, TOTAL: 2.4 g/dL (ref 1.5–4.5)
Glucose: 75 mg/dL (ref 65–99)
Potassium: 4 mmol/L (ref 3.5–5.2)
SODIUM: 140 mmol/L (ref 134–144)
Total Protein: 7 g/dL (ref 6.0–8.5)

## 2016-12-05 LAB — LIPID PANEL W/O CHOL/HDL RATIO
CHOLESTEROL TOTAL: 168 mg/dL (ref 100–169)
HDL: 80 mg/dL (ref >39)
LDL CALC: 75 mg/dL (ref 0–109)
Triglycerides: 63 mg/dL (ref 0–89)
VLDL Cholesterol Cal: 13 mg/dL (ref 5–40)

## 2016-12-05 LAB — TSH: TSH: 3.16 u[IU]/mL (ref 0.450–4.500)

## 2016-12-07 ENCOUNTER — Encounter: Payer: Self-pay | Admitting: Family Medicine

## 2016-12-17 ENCOUNTER — Other Ambulatory Visit: Payer: Self-pay | Admitting: Family Medicine

## 2017-02-03 ENCOUNTER — Other Ambulatory Visit: Payer: Self-pay | Admitting: Family Medicine

## 2017-02-03 ENCOUNTER — Encounter: Payer: Self-pay | Admitting: Family Medicine

## 2017-02-03 DIAGNOSIS — D649 Anemia, unspecified: Secondary | ICD-10-CM

## 2017-02-03 MED ORDER — FERROUS SULFATE 325 (65 FE) MG PO TABS: 325.0000 mg | ORAL_TABLET | Freq: Two times a day (BID) | ORAL | 3 refills | Status: DC

## 2017-02-03 NOTE — Progress Notes (Signed)
Will get labs drawn, iron supplement sent to her pharmacy

## 2017-02-04 NOTE — Telephone Encounter (Signed)
Routing to provider  

## 2017-02-19 NOTE — Telephone Encounter (Signed)
Patient's mother called to give the fax number for where the information needs to be faxed.  Patient;s mother would also like to be notified once fax has been sent.  Fax: [phone]

## 2017-08-10 ENCOUNTER — Encounter: Payer: Self-pay | Admitting: Family Medicine

## 2017-08-10 ENCOUNTER — Ambulatory Visit: Payer: BLUE CROSS/BLUE SHIELD | Admitting: Family Medicine

## 2017-08-10 VITALS — BP 118/78 | HR 85 | Temp 98.3°F | Wt 164.4 lb

## 2017-08-10 DIAGNOSIS — R300 Dysuria: Secondary | ICD-10-CM | POA: Diagnosis not present

## 2017-08-10 DIAGNOSIS — Z23 Encounter for immunization: Secondary | ICD-10-CM | POA: Diagnosis not present

## 2017-08-10 DIAGNOSIS — D649 Anemia, unspecified: Secondary | ICD-10-CM

## 2017-08-10 MED ORDER — URO-MP 118 MG PO CAPS: 1.0000 | ORAL_CAPSULE | Freq: Four times a day (QID) | ORAL | 3 refills | Status: DC

## 2017-08-10 NOTE — Patient Instructions (Addendum)

## 2017-08-10 NOTE — Progress Notes (Signed)
BP 118/78 (BP Location: Left Arm, Patient Position: Sitting, Cuff Size: Normal)   Pulse 85   Temp 98.3 F (36.8 C)   Wt 164 lb 7 oz (74.6 kg)   SpO2 97%   BMI 26.46 kg/m    Subjective:    Patient ID: Jessica Harris, female    DOB: August 02, 1996, 21 y.o.   MRN: [id number]  HPI: Jessica Harris is a 21 y.o. female  Chief Complaint  Patient presents with  . Anemia  . Medication Refill    Uro-mp   ANEMIA- tries to donate blood and isn't able to. Feeling well. Hgbs under 12.1 consistently. Anemia status: stable Etiology of anemia: unknown Duration of anemia treatment: Not on anything Compliance with treatment: fair compliance Iron supplementation side effects: no Severity of anemia: mild Fatigue: no Decreased exercise tolerance: no  Dyspnea on exertion: no Palpitations: no Bleeding: no Pica: no  Relevant past medical, surgical, family and social history reviewed and updated as indicated. Interim medical history since our last visit reviewed. Allergies and medications reviewed and updated.  Review of Systems  Constitutional: Negative.   Respiratory: Negative.   Cardiovascular: Negative.   Hematological: Negative.   Psychiatric/Behavioral: Negative.     Per HPI unless specifically indicated above     Objective:    BP 118/78 (BP Location: Left Arm, Patient Position: Sitting, Cuff Size: Normal)   Pulse 85   Temp 98.3 F (36.8 C)   Wt 164 lb 7 oz (74.6 kg)   SpO2 97%   BMI 26.46 kg/m   Wt Readings from Last 3 Encounters:  08/10/17 164 lb 7 oz (74.6 kg)  12/04/16 159 lb 8 oz (72.3 kg) (87 %, Z= 1.12)*  06/01/16 158 lb (71.7 kg) (87 %, Z= 1.12)*   * Growth percentiles are based on CDC (Girls, 2-20 Years) data.    Physical Exam  Constitutional: She is oriented to person, place, and time. She appears well-developed and well-nourished. No distress.  HENT:  Head: Normocephalic and atraumatic.  Right Ear: Hearing normal.  Left Ear: Hearing normal.  Nose: Nose  normal.  Eyes: Conjunctivae and lids are normal. Right eye exhibits no discharge. Left eye exhibits no discharge. No scleral icterus.  Cardiovascular: Normal rate, regular rhythm and intact distal pulses. Exam reveals no gallop and no friction rub.  Murmur heard. Pulmonary/Chest: Effort normal and breath sounds normal. No respiratory distress. She has no wheezes. She has no rales. She exhibits no tenderness.  Musculoskeletal: Normal range of motion.  Neurological: She is alert and oriented to person, place, and time.  Skin: Skin is warm, dry and intact. No rash noted. She is not diaphoretic. No erythema. No pallor.  Psychiatric: She has a normal mood and affect. Her speech is normal and behavior is normal. Judgment and thought content normal. Cognition and memory are normal.  Nursing note and vitals reviewed.   Results for orders placed or performed in visit on 12/04/16  Microscopic Examination  Result Value Ref Range   WBC, UA 0-5 0 - 5 /hpf   RBC, UA None seen 0 - 2 /hpf   Epithelial Cells (non renal) 0-10 0 - 10 /hpf   Bacteria, UA None seen None seen/Few  CBC with Differential/Platelet  Result Value Ref Range   WBC 4.7 3.4 - 10.8 x10E3/uL   RBC 4.29 3.77 - 5.28 x10E6/uL   Hemoglobin 11.5 11.1 - 15.9 g/dL   Hematocrit 40.934.7 81.134.0 - 46.6 %   MCV 81 79 - 97 fL  MCH 26.8 26.6 - 33.0 pg   MCHC 33.1 31.5 - 35.7 g/dL   RDW 10.216.1 (H) 72.512.3 - 36.615.4 %   Platelets 209 150 - 379 x10E3/uL   Neutrophils 60 Not Estab. %   Lymphs 27 Not Estab. %   Monocytes 10 Not Estab. %   Eos 2 Not Estab. %   Basos 1 Not Estab. %   Neutrophils Absolute 2.8 1.4 - 7.0 x10E3/uL   Lymphocytes Absolute 1.2 0.7 - 3.1 x10E3/uL   Monocytes Absolute 0.5 0.1 - 0.9 x10E3/uL   EOS (ABSOLUTE) 0.1 0.0 - 0.4 x10E3/uL   Basophils Absolute 0.0 0.0 - 0.2 x10E3/uL   Immature Granulocytes 0 Not Estab. %   Immature Grans (Abs) 0.0 0.0 - 0.1 x10E3/uL  Comprehensive metabolic panel  Result Value Ref Range   Glucose 75 65 -  99 mg/dL   BUN 14 6 - 20 mg/dL   Creatinine, Ser 4.400.73 0.57 - 1.00 mg/dL   GFR calc non Af Amer 120 >59 mL/min/1.73   GFR calc Af Amer 138 >59 mL/min/1.73   BUN/Creatinine Ratio 19 9 - 23   Sodium 140 134 - 144 mmol/L   Potassium 4.0 3.5 - 5.2 mmol/L   Chloride 99 96 - 106 mmol/L   CO2 28 18 - 29 mmol/L   Calcium 9.6 8.7 - 10.2 mg/dL   Total Protein 7.0 6.0 - 8.5 g/dL   Albumin 4.6 3.5 - 5.5 g/dL   Globulin, Total 2.4 1.5 - 4.5 g/dL   Albumin/Globulin Ratio 1.9 1.2 - 2.2   Bilirubin Total 0.4 0.0 - 1.2 mg/dL   Alkaline Phosphatase 66 39 - 117 IU/L   AST 17 0 - 40 IU/L   ALT 15 0 - 32 IU/L  Lipid Panel w/o Chol/HDL Ratio  Result Value Ref Range   Cholesterol, Total 168 100 - 169 mg/dL   Triglycerides 63 0 - 89 mg/dL   HDL 80 >34>39 mg/dL   VLDL Cholesterol Cal 13 5 - 40 mg/dL   LDL Calculated 75 0 - 109 mg/dL  TSH  Result Value Ref Range   TSH 3.160 0.450 - 4.500 uIU/mL  UA/M w/rflx Culture, Routine  Result Value Ref Range   Specific Gravity, UA 1.015 1.005 - 1.030   pH, UA 6.5 5.0 - 7.5   Color, UA Yellow Yellow   Appearance Ur Clear Clear   Leukocytes, UA Trace (A) Negative   Protein, UA Negative Negative/Trace   Glucose, UA Negative Negative   Ketones, UA Negative Negative   RBC, UA Negative Negative   Bilirubin, UA Negative Negative   Urobilinogen, Ur 0.2 0.2 - 1.0 mg/dL   Nitrite, UA Negative Negative   Microscopic Examination See below:       Assessment & Plan:   Problem List Items Addressed This Visit      Other   Anemia - Primary    Other Visit Diagnoses    Immunization due       Flu shot given today   Relevant Orders   Flu Vaccine QUAD 6+ mos PF IM (Fluarix Quad PF) (Completed)   Dysuria       Relevant Medications   Meth-Hyo-M Bl-Na Phos-Ph Sal (URO-MP) 118 MG CAPS       Follow up plan: Return April, for Physical.

## 2017-08-11 LAB — B12 AND FOLATE PANEL
FOLATE: 5.9 ng/mL (ref >3.0)
Vitamin B-12: 485 pg/mL (ref 232–1245)

## 2017-08-11 LAB — FERRITIN: Ferritin: 12 ng/mL — ABNORMAL LOW (ref 15–150)

## 2017-08-11 LAB — CBC WITH DIFFERENTIAL/PLATELET
BASOS ABS: 0 10*3/uL (ref 0.0–0.2)
BASOS: 1 %
EOS (ABSOLUTE): 0.1 10*3/uL (ref 0.0–0.4)
Eos: 2 %
Hematocrit: 35.7 % (ref 34.0–46.6)
Hemoglobin: 12 g/dL (ref 11.1–15.9)
IMMATURE GRANS (ABS): 0 10*3/uL (ref 0.0–0.1)
IMMATURE GRANULOCYTES: 0 %
LYMPHS: 30 %
Lymphocytes Absolute: 1.9 10*3/uL (ref 0.7–3.1)
MCH: 29.6 pg (ref 26.6–33.0)
MCHC: 33.6 g/dL (ref 31.5–35.7)
MCV: 88 fL (ref 79–97)
MONOS ABS: 0.4 10*3/uL (ref 0.1–0.9)
Monocytes: 6 %
NEUTROS PCT: 61 %
Neutrophils Absolute: 3.8 10*3/uL (ref 1.4–7.0)
PLATELETS: 207 10*3/uL (ref 150–379)
RBC: 4.05 x10E6/uL (ref 3.77–5.28)
RDW: 14 % (ref 12.3–15.4)
WBC: 6.2 10*3/uL (ref 3.4–10.8)

## 2017-08-11 LAB — IRON AND TIBC
IRON: 61 ug/dL (ref 27–159)
Iron Saturation: 17 % (ref 15–55)
Total Iron Binding Capacity: 361 ug/dL (ref 250–450)
UIBC: 300 ug/dL (ref 131–425)

## 2017-12-15 ENCOUNTER — Encounter: Payer: Self-pay | Admitting: Family Medicine

## 2017-12-15 ENCOUNTER — Ambulatory Visit: Payer: BLUE CROSS/BLUE SHIELD | Admitting: Family Medicine

## 2017-12-15 VITALS — BP 120/85 | HR 82 | Temp 98.6°F | Ht 65.3 in | Wt 169.2 lb

## 2017-12-15 DIAGNOSIS — N390 Urinary tract infection, site not specified: Secondary | ICD-10-CM

## 2017-12-15 DIAGNOSIS — N898 Other specified noninflammatory disorders of vagina: Secondary | ICD-10-CM | POA: Diagnosis not present

## 2017-12-15 DIAGNOSIS — N760 Acute vaginitis: Secondary | ICD-10-CM

## 2017-12-15 DIAGNOSIS — R399 Unspecified symptoms and signs involving the genitourinary system: Secondary | ICD-10-CM | POA: Diagnosis not present

## 2017-12-15 DIAGNOSIS — B9689 Other specified bacterial agents as the cause of diseases classified elsewhere: Secondary | ICD-10-CM | POA: Diagnosis not present

## 2017-12-15 MED ORDER — SULFAMETHOXAZOLE-TRIMETHOPRIM 800-160 MG PO TABS: 1.0000 | ORAL_TABLET | Freq: Two times a day (BID) | ORAL | 0 refills | Status: DC

## 2017-12-15 MED ORDER — PHENAZOPYRIDINE HCL 200 MG PO TABS: 200.0000 mg | ORAL_TABLET | Freq: Three times a day (TID) | ORAL | 0 refills | Status: DC | PRN

## 2017-12-15 MED ORDER — METRONIDAZOLE 500 MG PO TABS: 500.0000 mg | ORAL_TABLET | Freq: Two times a day (BID) | ORAL | 0 refills | Status: DC

## 2017-12-15 NOTE — Progress Notes (Signed)
BP 120/85   Pulse 82   Temp 98.6 F (37 C) (Oral)   Ht 5' 5.3" (1.659 m)   Wt 169 lb 3.2 oz (76.7 kg)   SpO2 100%   BMI 27.90 kg/m    Subjective:    Patient ID: Jessica Harris, female    DOB: [birth year], 22 y.o.   MRN: [id number]  HPI: Jessica Harris is a 22 y.o. female  Chief Complaint  Patient presents with  . Urinary Tract Infection    pt states she has had pain with urination since yesterday  . Vaginal Discharge    pt states she has been having discharge for the past 2 days   Pt here today with pelvic pressure, dark urine,mild dysuria, and vaginal discharge x 1-2 days. Has had a hx of cystitis. Took some AZO with some relief. Denies fever, chills, N/V/D, concerns for STIs, vaginal lesions or odor, hematuria.   Relevant past medical, surgical, family and social history reviewed and updated as indicated. Interim medical history since our last visit reviewed. Allergies and medications reviewed and updated.  Review of Systems  Per HPI unless specifically indicated above     Objective:    BP 120/85   Pulse 82   Temp 98.6 F (37 C) (Oral)   Ht 5' 5.3" (1.659 m)   Wt 169 lb 3.2 oz (76.7 kg)   SpO2 100%   BMI 27.90 kg/m   Wt Readings from Last 3 Encounters:  12/15/17 169 lb 3.2 oz (76.7 kg)  08/10/17 164 lb 7 oz (74.6 kg)  12/04/16 159 lb 8 oz (72.3 kg) (87 %, Z= 1.12)*   * Growth percentiles are based on CDC (Girls, 2-20 Years) data.    Physical Exam  Constitutional: She is oriented to person, place, and time. She appears well-developed and well-nourished. No distress.  HENT:  Head: Atraumatic.  Eyes: Pupils are equal, round, and reactive to light. Conjunctivae are normal.  Neck: Normal range of motion. Neck supple.  Cardiovascular: Normal rate and regular rhythm.  Pulmonary/Chest: Effort normal and breath sounds normal.  Abdominal: Soft. Bowel sounds are normal. She exhibits no distension. There is no tenderness.  Genitourinary: Vaginal discharge found.    Musculoskeletal: Normal range of motion. She exhibits no tenderness (No CVA tenderness).  Neurological: She is alert and oriented to person, place, and time.  Skin: Skin is warm and dry.  Psychiatric: She has a normal mood and affect. Her behavior is normal.  Nursing note and vitals reviewed.   Results for orders placed or performed in visit on 12/15/17  WET PREP FOR TRICH, YEAST, CLUE  Result Value Ref Range   Trichomonas Exam Negative Negative   Yeast Exam Negative Negative   Clue Cell Exam Positive (A) Negative  Microscopic Examination  Result Value Ref Range   WBC, UA 0-5 0 - 5 /hpf   RBC, UA None seen 0 - 2 /hpf   Epithelial Cells (non renal) 0-10 0 - 10 /hpf   Bacteria, UA Few None seen/Few  Urine Culture, Reflex  Result Value Ref Range   Urine Culture, Routine Final report    Organism ID, Bacteria Comment   UA/M w/rflx Culture, Routine  Result Value Ref Range   Specific Gravity, UA <1.005 (L) 1.005 - 1.030   pH, UA 7.0 5.0 - 7.5   Color, UA Straw Yellow   Appearance Ur Clear Clear   Leukocytes, UA 2+ (A) Negative   Protein, UA Negative Negative/Trace   Glucose, UA Negative  Negative   Ketones, UA Negative Negative   RBC, UA Negative Negative   Bilirubin, UA Negative Negative   Urobilinogen, Ur 0.2 0.2 - 1.0 mg/dL   Nitrite, UA Negative Negative   Microscopic Examination See below:    Urinalysis Reflex Comment       Assessment & Plan:   Problem List Items Addressed This Visit    None    Visit Diagnoses    Acute lower UTI    -  Primary   U/A showing mild UTI, will tx with bactrim and pyridium. Push fluids, probiotics, prevention tips reviewed. F/u if not improving, await cx   Relevant Medications   phenazopyridine (PYRIDIUM) 200 MG tablet   metroNIDAZOLE (FLAGYL) 500 MG tablet   sulfamethoxazole-trimethoprim (BACTRIM DS,SEPTRA DS) 800-160 MG tablet   Other Relevant Orders   UA/M w/rflx Culture, Routine (Completed)   BV (bacterial vaginosis)       Wet prep  +, will tx with flagyl, probiotics, good vaginal hygiene. F/u if worsening or no improvement   Relevant Medications   metroNIDAZOLE (FLAGYL) 500 MG tablet   sulfamethoxazole-trimethoprim (BACTRIM DS,SEPTRA DS) 800-160 MG tablet   Other Relevant Orders   WET PREP FOR TRICH, YEAST, CLUE (Completed)       Follow up plan: Return if symptoms worsen or fail to improve.

## 2017-12-16 LAB — WET PREP FOR TRICH, YEAST, CLUE
Clue Cell Exam: POSITIVE — AB
TRICHOMONAS EXAM: NEGATIVE
YEAST EXAM: NEGATIVE

## 2017-12-17 LAB — UA/M W/RFLX CULTURE, ROUTINE
BILIRUBIN UA: NEGATIVE
Glucose, UA: NEGATIVE
KETONES UA: NEGATIVE
NITRITE UA: NEGATIVE
Protein, UA: NEGATIVE
RBC UA: NEGATIVE
Specific Gravity, UA: <1.005 — ABNORMAL LOW (ref 1.005–1.030)
Urobilinogen, Ur: 0.2 mg/dL (ref 0.2–1.0)
pH, UA: 7 (ref 5.0–7.5)

## 2017-12-17 LAB — MICROSCOPIC EXAMINATION: RBC MICROSCOPIC, UA: NONE SEEN /HPF (ref 0–2)

## 2017-12-17 LAB — URINE CULTURE, REFLEX

## 2017-12-18 NOTE — Patient Instructions (Signed)
Follow up as needed

## 2018-03-31 ENCOUNTER — Encounter: Payer: Self-pay | Admitting: Family Medicine

## 2018-03-31 ENCOUNTER — Ambulatory Visit: Payer: BLUE CROSS/BLUE SHIELD | Admitting: Family Medicine

## 2018-03-31 VITALS — BP 131/78 | HR 77 | Temp 98.6°F | Wt 164.6 lb

## 2018-03-31 DIAGNOSIS — R229 Localized swelling, mass and lump, unspecified: Secondary | ICD-10-CM | POA: Diagnosis not present

## 2018-03-31 DIAGNOSIS — IMO0002 Reserved for concepts with insufficient information to code with codable children: Secondary | ICD-10-CM

## 2018-03-31 NOTE — Progress Notes (Signed)
BP 131/78 (BP Location: Left Arm, Patient Position: Sitting, Cuff Size: Normal)   Pulse 77   Temp 98.6 F (37 C)   Wt 164 lb 9 oz (74.6 kg)   SpO2 100%   BMI 27.13 kg/m    Subjective:    Patient ID: Jessica Harris, female    DOB: 1996/05/07, 22 y.o.   MRN: [id number]  HPI: Jessica Harris is a 22 y.o. female  Chief Complaint  Patient presents with  . Mass   LUMP Duration: 4 month Location: R thigh Onset: gradual Painful: no Discomfort: no Status:  bigger Trauma: no Redness: no Bruising: no Recent infection: no Swollen lymph nodes: no Requesting removal: no History of cancer: no Family history of cancer: yes History of the same: no Associated signs and symptoms:   Relevant past medical, surgical, family and social history reviewed and updated as indicated. Interim medical history since our last visit reviewed. Allergies and medications reviewed and updated.  Review of Systems  Constitutional: Negative.   Respiratory: Negative.   Cardiovascular: Negative.   Musculoskeletal: Negative.   Psychiatric/Behavioral: Negative.     Per HPI unless specifically indicated above     Objective:    BP 131/78 (BP Location: Left Arm, Patient Position: Sitting, Cuff Size: Normal)   Pulse 77   Temp 98.6 F (37 C)   Wt 164 lb 9 oz (74.6 kg)   SpO2 100%   BMI 27.13 kg/m   Wt Readings from Last 3 Encounters:  03/31/18 164 lb 9 oz (74.6 kg)  12/15/17 169 lb 3.2 oz (76.7 kg)  08/10/17 164 lb 7 oz (74.6 kg)    Physical Exam  Constitutional: She is oriented to person, place, and time. She appears well-developed and well-nourished. No distress.  HENT:  Head: Normocephalic and atraumatic.  Right Ear: Hearing normal.  Left Ear: Hearing normal.  Nose: Nose normal.  Eyes: Conjunctivae and lids are normal. Right eye exhibits no discharge. Left eye exhibits no discharge. No scleral icterus.  Cardiovascular: Normal rate, regular rhythm, normal heart sounds and intact distal  pulses. Exam reveals no gallop and no friction rub.  No murmur heard. Pulmonary/Chest: Effort normal and breath sounds normal. No stridor. No respiratory distress. She has no wheezes. She has no rales. She exhibits no tenderness.  Musculoskeletal: Normal range of motion.  Enlarged quadriceps on the R, no tenderness, no redness, no bruising  Neurological: She is alert and oriented to person, place, and time.  Skin: Skin is warm, dry and intact. Capillary refill takes less than 2 seconds. No rash noted. She is not diaphoretic. No erythema. No pallor.  Psychiatric: She has a normal mood and affect. Her speech is normal and behavior is normal. Judgment and thought content normal. Cognition and memory are normal.  Nursing note and vitals reviewed.   Results for orders placed or performed in visit on 12/15/17  WET PREP FOR TRICH, YEAST, CLUE  Result Value Ref Range   Trichomonas Exam Negative Negative   Yeast Exam Negative Negative   Clue Cell Exam Positive (A) Negative  Microscopic Examination  Result Value Ref Range   WBC, UA 0-5 0 - 5 /hpf   RBC, UA None seen 0 - 2 /hpf   Epithelial Cells (non renal) 0-10 0 - 10 /hpf   Bacteria, UA Few None seen/Few  Urine Culture, Reflex  Result Value Ref Range   Urine Culture, Routine Final report    Organism ID, Bacteria Comment   UA/M w/rflx Culture, Routine  Result Value Ref Range   Specific Gravity, UA <1.005 (L) 1.005 - 1.030   pH, UA 7.0 5.0 - 7.5   Color, UA Straw Yellow   Appearance Ur Clear Clear   Leukocytes, UA 2+ (A) Negative   Protein, UA Negative Negative/Trace   Glucose, UA Negative Negative   Ketones, UA Negative Negative   RBC, UA Negative Negative   Bilirubin, UA Negative Negative   Urobilinogen, Ur 0.2 0.2 - 1.0 mg/dL   Nitrite, UA Negative Negative   Microscopic Examination See below:    Urinalysis Reflex Comment       Assessment & Plan:   Problem List Items Addressed This Visit    None    Visit Diagnoses    Lump     -  Primary   Will check [place] to look at muscle and see what's going on. May need MRI pending results. Call with any concerns.    Relevant Orders   US SOFT TISSUE LOWER EXTREMITY LIMITED RIGHT (NON-VASCULAR)       Follow up plan: Return if symptoms worsen or fail to improve.

## 2018-04-05 ENCOUNTER — Ambulatory Visit
Admission: RE | Admit: 2018-04-05 | Discharge: 2018-04-05 | Disposition: A | Discharge status: Home / Self Care | Payer: BLUE CROSS/BLUE SHIELD | Source: Ambulatory Visit | Attending: Family Medicine | Admitting: Family Medicine

## 2018-04-05 ENCOUNTER — Ambulatory Visit: Payer: PRIVATE HEALTH INSURANCE

## 2018-04-05 DIAGNOSIS — R2241 Localized swelling, mass and lump, right lower limb: Secondary | ICD-10-CM | POA: Diagnosis not present

## 2018-04-05 DIAGNOSIS — IMO0002 Reserved for concepts with insufficient information to code with codable children: Secondary | ICD-10-CM

## 2018-04-05 DIAGNOSIS — R229 Localized swelling, mass and lump, unspecified: Secondary | ICD-10-CM | POA: Insufficient documentation

## 2018-04-06 ENCOUNTER — Telehealth: Payer: Self-pay | Admitting: Family Medicine

## 2018-04-06 NOTE — Telephone Encounter (Signed)
Please let her know that her US was normal. No sign of any lipomas, bumps or anything. It's likely just a slightly larger muscle on that side. i'm not sure why that happened, but the US does not show anything concerning.

## 2018-04-06 NOTE — Telephone Encounter (Signed)
Left message on machine for pt to return call to the office. CRM created.  

## 2018-04-07 NOTE — Telephone Encounter (Signed)
Left message on machine for pt to return call to the office.  

## 2018-04-08 ENCOUNTER — Ambulatory Visit: Payer: PRIVATE HEALTH INSURANCE

## 2018-04-08 NOTE — Telephone Encounter (Signed)
Called patient, no answer, left a voicemail for patient to return my call.   

## 2018-04-11 NOTE — Telephone Encounter (Signed)
Patient notified

## 2018-08-05 ENCOUNTER — Other Ambulatory Visit: Payer: Self-pay | Admitting: Family Medicine

## 2018-08-05 ENCOUNTER — Telehealth: Payer: Self-pay | Admitting: Family Medicine

## 2018-08-05 MED ORDER — ACYCLOVIR 400 MG PO TABS: 400.0000 mg | ORAL_TABLET | Freq: Two times a day (BID) | ORAL | 1 refills | Status: AC

## 2018-08-05 NOTE — Telephone Encounter (Signed)
Appointment needed for next refill.

## 2018-08-05 NOTE — Telephone Encounter (Signed)
Call placed to patient. VM to return call to office. Need to schedule physical. Last 12/04/16.

## 2018-08-05 NOTE — Telephone Encounter (Signed)
Copied from CRM [id number]. Topic: Quick Communication - Rx Refill/Question >> Aug 05, 2018  9:34 AM Baldo DaubAlexander, Amber L wrote: Medication: acyclovir (ZOVIRAX) 400 MG tablet  Has the patient contacted their pharmacy? Yes - states that pharmacy can't transfer to new pharmacy because there are no refills left.  Pt only has three pills left (Agent: If no, request that the patient contact the pharmacy for the refill.) (Agent: If yes, when and what did the pharmacy advise?)  Preferred Pharmacy (with phone number or street name): [id number] [place] - [phone] [phone] (Phone) [phone] (Fax)  Agent: Please be advised that RX refills may take up to 3 business days. We ask that you follow-up with your pharmacy.

## 2018-12-31 DIAGNOSIS — N9489 Other specified conditions associated with female genital organs and menstrual cycle: Secondary | ICD-10-CM | POA: Diagnosis not present

## 2019-01-02 ENCOUNTER — Telehealth: Payer: BLUE CROSS/BLUE SHIELD | Admitting: Family Medicine

## 2019-01-02 ENCOUNTER — Other Ambulatory Visit: Payer: Self-pay

## 2019-09-19 DIAGNOSIS — Z20828 Contact with and (suspected) exposure to other viral communicable diseases: Secondary | ICD-10-CM | POA: Diagnosis not present

## 2019-12-11 ENCOUNTER — Other Ambulatory Visit: Payer: Self-pay

## 2019-12-11 ENCOUNTER — Ambulatory Visit (INDEPENDENT_AMBULATORY_CARE_PROVIDER_SITE_OTHER): Payer: BLUE CROSS/BLUE SHIELD | Admitting: Nurse Practitioner

## 2019-12-11 ENCOUNTER — Encounter: Payer: Self-pay | Admitting: Nurse Practitioner

## 2019-12-11 DIAGNOSIS — F3281 Premenstrual dysphoric disorder: Secondary | ICD-10-CM | POA: Diagnosis not present

## 2019-12-11 MED ORDER — SERTRALINE HCL 25 MG PO TABS: 25.0000 mg | ORAL_TABLET | Freq: Every day | ORAL | 3 refills | Status: AC

## 2019-12-11 NOTE — Progress Notes (Signed)
BP 136/84 (BP Location: Left Arm, Patient Position: Sitting, Cuff Size: Normal)   Pulse 99   Temp 97.6 F (36.4 C) (Oral)   Wt 159 lb 2 oz (72.2 kg)   SpO2 100%   BMI 26.24 kg/m    Subjective:    Patient ID: Jessica Harris, female    DOB: Jun 09, 1996, 24 y.o.   MRN: [id number]  HPI: Jessica Harris is a 24 y.o. female  Chief Complaint  Patient presents with  . Depression   DEPRESSION Presents today to discuss anxiety/depression - PMDD.  Notices mood swings start about 2 weeks before period, becomes more anxious/hopeless/tearful.  This lasts halfway through week of period and then the following week feels back to normal self.  Has dealt with this over the past year, with worsening or last 1/2 year.  No current birth control.   No mental health history in immediate family.  She recently graduated and currently works at [employer].  She is not currently pregnant or sexually active.  Is moving to [place] with family in a few weeks. Mood status: uncontrolled Satisfied with current treatment?: yes Symptom severity: mild  Duration of current treatment : months Psychotherapy/counseling: none Anxious mood: yes Anhedonia: no Significant weight loss or gain: no Insomnia: yes hard to fall asleep Fatigue: yes Feelings of worthlessness or guilt: yes Impaired concentration/indecisiveness: yes Suicidal ideations: no plan, but does occasionally have fleeting thoughts Hopelessness: yes Crying spells: yes Depression screen [facility] 2/9 12/11/2019 03/31/2018 12/04/2016  Decreased Interest 2 0 0  Down, Depressed, Hopeless 2 0 0  PHQ - 2 Score 4 0 0  Altered sleeping 1 0 -  Tired, decreased energy 2 0 -  Change in appetite 2 0 -  Feeling bad or failure about yourself  3 0 -  Trouble concentrating 2 0 -  Moving slowly or fidgety/restless 1 0 -  Suicidal thoughts 0 0 -  PHQ-9 Score 15 0 -  Difficult doing work/chores Very difficult Not difficult at all -   GAD 7 : Generalized Anxiety Score  12/11/2019  Nervous, Anxious, on Edge 2  Control/stop worrying 2  Worry too much - different things 3  Trouble relaxing 2  Restless 2  Easily annoyed or irritable 2  Afraid - awful might happen 2  Total GAD 7 Score 15  Anxiety Difficulty Very difficult    Relevant past medical, surgical, family and social history reviewed and updated as indicated. Interim medical history since our last visit reviewed. Allergies and medications reviewed and updated.  Review of Systems  Constitutional: Negative for activity change, appetite change, diaphoresis, fatigue and fever.  Respiratory: Negative for cough, chest tightness and shortness of breath.   Cardiovascular: Negative for chest pain, palpitations and leg swelling.  Gastrointestinal: Negative.   Endocrine: Negative for cold intolerance, heat intolerance, polydipsia, polyphagia and polyuria.  Neurological: Negative.   Psychiatric/Behavioral: Positive for decreased concentration and sleep disturbance. Negative for self-injury and suicidal ideas. The patient is nervous/anxious.     Per HPI unless specifically indicated above     Objective:    BP 136/84 (BP Location: Left Arm, Patient Position: Sitting, Cuff Size: Normal)   Pulse 99   Temp 97.6 F (36.4 C) (Oral)   Wt 159 lb 2 oz (72.2 kg)   SpO2 100%   BMI 26.24 kg/m   Wt Readings from Last 3 Encounters:  12/11/19 159 lb 2 oz (72.2 kg)  03/31/18 164 lb 9 oz (74.6 kg)  12/15/17 169 lb 3.2 oz (  76.7 kg)    Physical Exam Vitals and nursing note reviewed.  Constitutional:      General: She is awake. She is not in acute distress.    Appearance: She is well-developed and well-groomed. She is not ill-appearing.  HENT:     Head: Normocephalic.     Right Ear: Hearing normal.     Left Ear: Hearing normal.  Eyes:     General: Lids are normal.        Right eye: No discharge.        Left eye: No discharge.     Conjunctiva/sclera: Conjunctivae normal.     Pupils: Pupils are equal,  round, and reactive to light.  Neck:     Vascular: No carotid bruit.  Cardiovascular:     Rate and Rhythm: Normal rate and regular rhythm.     Heart sounds: Normal heart sounds. No murmur. No gallop.   Pulmonary:     Effort: Pulmonary effort is normal. No accessory muscle usage or respiratory distress.     Breath sounds: Normal breath sounds.  Abdominal:     General: Bowel sounds are normal.     Palpations: Abdomen is soft.  Musculoskeletal:     Cervical back: Normal range of motion and neck supple.     Right lower leg: No edema.     Left lower leg: No edema.  Skin:    General: Skin is warm and dry.  Neurological:     Mental Status: She is alert and oriented to person, place, and time.  Psychiatric:        Attention and Perception: Attention normal.        Mood and Affect: Mood is anxious.        Speech: Speech normal.        Behavior: Behavior normal. Behavior is cooperative.        Thought Content: Thought content normal.     Comments: Quiet and anxious mood, picking at fingernails.     Results for orders placed or performed in visit on 12/15/17  WET PREP FOR TRICH, YEAST, CLUE   Specimen: Vaginal Fluid   VAGINAL FLUI  Result Value Ref Range   Trichomonas Exam Negative Negative   Yeast Exam Negative Negative   Clue Cell Exam Positive (A) Negative  Microscopic Examination   URINE  Result Value Ref Range   WBC, UA 0-5 0 - 5 /hpf   RBC, UA None seen 0 - 2 /hpf   Epithelial Cells (non renal) 0-10 0 - 10 /hpf   Bacteria, UA Few None seen/Few  Urine Culture, Reflex   URINE  Result Value Ref Range   Urine Culture, Routine Final report    Organism ID, Bacteria Comment   UA/M w/rflx Culture, Routine   Specimen: Urine   URINE  Result Value Ref Range   Specific Gravity, UA <1.005 (L) 1.005 - 1.030   pH, UA 7.0 5.0 - 7.5   Color, UA Straw Yellow   Appearance Ur Clear Clear   Leukocytes, UA 2+ (A) Negative   Protein, UA Negative Negative/Trace   Glucose, UA Negative  Negative   Ketones, UA Negative Negative   RBC, UA Negative Negative   Bilirubin, UA Negative Negative   Urobilinogen, Ur 0.2 0.2 - 1.0 mg/dL   Nitrite, UA Negative Negative   Microscopic Examination See below:    Urinalysis Reflex Comment       Assessment & Plan:   Problem List Items Addressed This Visit  Other   PMDD (premenstrual dysphoric disorder)    With anxiety and depression.  Discussed options with patient.  At this time will start out with SSRI, Zoloft 25 MG daily script sent in and if tolerating with minimal benefit in a couple weeks may increase to 50 MG (two tablets) -- educated patient on this and medication + side effects.  She denies SI/HI at this time, did report occasional fleeting thoughts in past but no plan and has safe home environment.  In future once mood stable, may benefit from BCP for further benefit to PMDD, which may allow for discontinuation of SSRI overtime.  Discussed anxiety techniques to utilize, such as deep breathing.  Return in 4 weeks for virtual follow-up.  If any SI presents immediately call office or go to ER, she agrees with this plan.      Relevant Medications   sertraline (ZOLOFT) 25 MG tablet       Follow up plan: Return in about 4 weeks (around 01/08/2020) for MOOD.

## 2019-12-11 NOTE — Assessment & Plan Note (Signed)
With anxiety and depression.  Discussed options with patient.  At this time will start out with SSRI, Zoloft 25 MG daily script sent in and if tolerating with minimal benefit in a couple weeks may increase to 50 MG (two tablets) -- educated patient on this and medication + side effects.  She denies SI/HI at this time, did report occasional fleeting thoughts in past but no plan and has safe home environment.  In future once mood stable, may benefit from BCP for further benefit to PMDD, which may allow for discontinuation of SSRI overtime.  Discussed anxiety techniques to utilize, such as deep breathing.  Return in 4 weeks for virtual follow-up.  If any SI presents immediately call office or go to ER, she agrees with this plan.

## 2019-12-11 NOTE — Patient Instructions (Signed)
Premenstrual Syndrome Premenstrual syndrome (PMS) is a group of physical, emotional, and behavioral symptoms that affect women of childbearing age as part of their menstrual cycle. PMS starts 1-2 weeks before the start of a woman's menstrual period and goes away a few days after menstrual bleeding starts. It often happens in a predictable pattern (recurs). PMS may cause other health conditions to become worse, such as asthma, allergies, and migraines. PMS can range from mild to severe. When it is severe, it is called premenstrual dysphoric disorder (PMDD). PMS may interfere with normal daily activities. What are the causes? The cause of this condition is not known, but it seems to be related to hormone changes that happen before menstruation. What are the signs or symptoms? Symptoms of this condition often happen every month. They go away completely after your period starts. Physical symptoms of this condition include:  Bloating.  Breast pain.  Headaches.  Extreme fatigue.  Backaches.  Swelling of the hands and feet.  Weight gain.  Hot flashes. Emotional and behavioral symptoms of this condition include:  Mood swings.  Depression.  Angry outbursts.  Irritability.  Anxiety.  Crying spells.  Food cravings or appetite changes.  Changes in sexual desire.  Confusion.  Aggression.  Social withdrawal.  Poor concentration. How is this diagnosed? This condition may be diagnosed based on a history of your symptoms. This condition is generally diagnosed if symptoms of PMS:  Are present in the 5 days before your period starts.  End within 4 days after your period starts.  Happen at least 3 months in a row.  Interfere with some of your normal activities. Other conditions that can cause some of these symptoms must be ruled out before PMS can be diagnosed. These include depression, anxiety, anemia, and thyroid problems. How is this treated? This condition may be treated  by:  Maintaining a healthy lifestyle. This includes eating a well-balanced diet and exercising regularly.  Taking medicines. Medicines can help relieve symptoms such as cramps, aches, pains, headaches, and breast tenderness. Depending on the severity of the condition, your health care provider may recommend various over-the-counter pain medicines. Follow these instructions at home: Eating and drinking   Eat a well-balanced diet.  Avoid caffeine and alcohol.  Limit the amount of salt and salty foods you eat. This will help reduce bloating.  Drink enough fluid to keep your urine pale yellow.  Take a multivitamin if told to do so by your health care provider. Lifestyle   Do not use any products that contain nicotine or tobacco, such as cigarettes, e-cigarettes, and chewing tobacco. If you need help quitting, ask your health care provider.  Exercise regularly as suggested by your health care provider.  Get enough sleep. For most adults, this is 7-8 hours of sleep each night.  Practice relaxation techniques such as yoga, tai chi, or meditation.  Find healthy ways to manage stress. General instructions   For 2-3 months, write down your symptoms, their severity, and how long they last. This will help your health care provider choose the best treatment for you.  Take over-the-counter and prescription medicines only as told by your health care provider.  If you are using birth control pills (oral contraceptives), use them as told by your health care provider. Contact a health care provider if:  Your symptoms get worse.  You develop new symptoms.  You have trouble doing your daily activities. Summary  Premenstrual syndrome (PMS) is a group of physical, emotional, and behavioral symptoms that   affect women of childbearing age.  PMS starts 1-2 weeks before the start of a woman's period and goes away a few days after the period starts.  PMS is treated by maintaining a healthy  lifestyle and taking medicines to relieve the symptoms. This information is not intended to replace advice given to you by your health care provider. Make sure you discuss any questions you have with your health care provider. Document Revised: 03/23/2018 Document Reviewed: 03/23/2018 Elsevier Patient Education  2020 [employer]vier Inc.  

## 2020-01-01 ENCOUNTER — Telehealth (INDEPENDENT_AMBULATORY_CARE_PROVIDER_SITE_OTHER): Payer: BLUE CROSS/BLUE SHIELD | Admitting: Nurse Practitioner

## 2020-01-01 ENCOUNTER — Encounter: Payer: Self-pay | Admitting: Nurse Practitioner

## 2020-01-01 DIAGNOSIS — F3281 Premenstrual dysphoric disorder: Secondary | ICD-10-CM | POA: Diagnosis not present

## 2020-01-01 NOTE — Progress Notes (Signed)
LMP 12/09/2019 (Exact Date)    Subjective:    Patient ID: Jessica Harris, female    DOB: 07-May-1996, 24 y.o.   MRN: [id number]  HPI: Jessica Harris is a 24 y.o. female  Chief Complaint  Patient presents with  . PMDD    . This visit was completed via MyChart due to the restrictions of the COVID-19 pandemic. All issues as above were discussed and addressed. Physical exam was done as above through visual confirmation on MyChart. If it was felt that the patient should be evaluated in the office, they were directed there. The patient verbally consented to this visit. . Location of the patient: home . Location of the provider: work . Those involved with this call:  . Provider: Aura Dials, DNP . CMA: Wilhemena Durie, CMA . Front Desk/Registration: Adela Ports  . Time spent on call: 15 minutes with patient face to face via video conference. More than 50% of this time was spent in counseling and coordination of care. 10 minutes total spent in review of patient's record and preparation of their chart.  . I verified patient identity using two factors (patient name and date of birth). Patient consents verbally to being seen via telemedicine visit today.   DEPRESSION Started on Sertraline 25 MG daily as last visit for PMDD.  She reports overall improvement in mood with this dose.  Currently has moved to [place] with her family. Mood status: stable Satisfied with current treatment?: yes Symptom severity: mild  Duration of current treatment : months Side effects: no Medication compliance: good compliance Psychotherapy/counseling: none Previous psychiatric medications: Sertraline Depressed mood: no Anxious mood: no Anhedonia: no Significant weight loss or gain: no Insomnia: none Fatigue: no Feelings of worthlessness or guilt: no Impaired concentration/indecisiveness: no Suicidal ideations: no Hopelessness: no Crying spells: no Depression screen [facility] 2/9 01/01/2020 12/11/2019  03/31/2018 12/04/2016  Decreased Interest 0 2 0 0  Down, Depressed, Hopeless 0 2 0 0  PHQ - 2 Score 0 4 0 0  Altered sleeping 0 1 0 -  Tired, decreased energy 2 2 0 -  Change in appetite 0 2 0 -  Feeling bad or failure about yourself  0 3 0 -  Trouble concentrating 0 2 0 -  Moving slowly or fidgety/restless 0 1 0 -  Suicidal thoughts 0 0 0 -  PHQ-9 Score 2 15 0 -  Difficult doing work/chores Not difficult at all Very difficult Not difficult at all -   GAD 7 : Generalized Anxiety Score 01/01/2020 12/11/2019  Nervous, Anxious, on Edge 0 2  Control/stop worrying 0 2  Worry too much - different things 0 3  Trouble relaxing 0 2  Restless 0 2  Easily annoyed or irritable 0 2  Afraid - awful might happen 0 2  Total GAD 7 Score 0 15  Anxiety Difficulty Not difficult at all Very difficult   Relevant past medical, surgical, family and social history reviewed and updated as indicated. Interim medical history since our last visit reviewed. Allergies and medications reviewed and updated.  Review of Systems  Constitutional: Negative for activity change, appetite change, diaphoresis, fatigue and fever.  Respiratory: Negative for cough, chest tightness and shortness of breath.   Cardiovascular: Negative for chest pain, palpitations and leg swelling.  Gastrointestinal: Negative.   Neurological: Negative.   Psychiatric/Behavioral: Negative for decreased concentration, self-injury, sleep disturbance and suicidal ideas. The patient is not nervous/anxious.     Per HPI unless specifically indicated above  Objective:    LMP 12/09/2019 (Exact Date)   Wt Readings from Last 3 Encounters:  12/11/19 159 lb 2 oz (72.2 kg)  03/31/18 164 lb 9 oz (74.6 kg)  12/15/17 169 lb 3.2 oz (76.7 kg)    Physical Exam Vitals and nursing note reviewed.  Constitutional:      General: She is awake. She is not in acute distress.    Appearance: She is well-developed. She is not ill-appearing.  HENT:     Head:  Normocephalic.     Right Ear: Hearing normal.     Left Ear: Hearing normal.  Eyes:     General: Lids are normal.        Right eye: No discharge.        Left eye: No discharge.     Conjunctiva/sclera: Conjunctivae normal.  Pulmonary:     Effort: Pulmonary effort is normal. No accessory muscle usage or respiratory distress.  Musculoskeletal:     Cervical back: Normal range of motion.  Neurological:     Mental Status: She is alert and oriented to person, place, and time.  Psychiatric:        Attention and Perception: Attention normal.        Mood and Affect: Mood normal.        Behavior: Behavior normal. Behavior is cooperative.        Thought Content: Thought content normal.        Judgment: Judgment normal.     Results for orders placed or performed in visit on 12/15/17  WET PREP FOR [place], YEAST, CLUE   Specimen: Vaginal Fluid   VAGINAL FLUI  Result Value Ref Range   Trichomonas Exam Negative Negative   Yeast Exam Negative Negative   Clue Cell Exam Positive (A) Negative  Microscopic Examination   URINE  Result Value Ref Range   WBC, UA 0-5 0 - 5 /hpf   RBC, UA None seen 0 - 2 /hpf   Epithelial Cells (non renal) 0-10 0 - 10 /hpf   Bacteria, UA Few None seen/Few  Urine Culture, Reflex   URINE  Result Value Ref Range   Urine Culture, Routine Final report    Organism ID, Bacteria Comment   UA/M w/rflx Culture, Routine   Specimen: Urine   URINE  Result Value Ref Range   Specific Gravity, UA <1.005 (L) 1.005 - 1.030   pH, UA 7.0 5.0 - 7.5   Color, UA Straw Yellow   Appearance Ur Clear Clear   Leukocytes, UA 2+ (A) Negative   Protein, UA Negative Negative/Trace   Glucose, UA Negative Negative   Ketones, UA Negative Negative   RBC, UA Negative Negative   Bilirubin, UA Negative Negative   Urobilinogen, Ur 0.2 0.2 - 1.0 mg/dL   Nitrite, UA Negative Negative   Microscopic Examination See below:    Urinalysis Reflex Comment       Assessment & Plan:   Problem List  Items Addressed This Visit      Other   PMDD (premenstrual dysphoric disorder)    Ongoing and improved.  Will continue Sertraline 25 MG daily as improvement noted with this PHQ9 decreased from 15 to 2 and GAD from 15 to 0.  Last visit sent 90 day supply with 3 refills, this will last until she is able to establish care with new provider in [place].  Follow-up as needed, has currently moved to [employer] [place].         I discussed the assessment and treatment  plan with the patient. The patient was provided an opportunity to ask questions and all were answered. The patient agreed with the plan and demonstrated an understanding of the instructions.   The patient was advised to call back or seek an in-person evaluation if the symptoms worsen or if the condition fails to improve as anticipated.   I provided 15+ minutes of time during this encounter.  Follow up plan: No follow-ups on file.

## 2020-01-01 NOTE — Patient Instructions (Signed)
Premenstrual Syndrome Premenstrual syndrome (PMS) is a group of physical, emotional, and behavioral symptoms that affect women of childbearing age as part of their menstrual cycle. PMS starts 1-2 weeks before the start of a woman's menstrual period and goes away a few days after menstrual bleeding starts. It often happens in a predictable pattern (recurs). PMS may cause other health conditions to become worse, such as asthma, allergies, and migraines. PMS can range from mild to severe. When it is severe, it is called premenstrual dysphoric disorder (PMDD). PMS may interfere with normal daily activities. What are the causes? The cause of this condition is not known, but it seems to be related to hormone changes that happen before menstruation. What are the signs or symptoms? Symptoms of this condition often happen every month. They go away completely after your period starts. Physical symptoms of this condition include:  Bloating.  Breast pain.  Headaches.  Extreme fatigue.  Backaches.  Swelling of the hands and feet.  Weight gain.  Hot flashes. Emotional and behavioral symptoms of this condition include:  Mood swings.  Depression.  Angry outbursts.  Irritability.  Anxiety.  Crying spells.  Food cravings or appetite changes.  Changes in sexual desire.  Confusion.  Aggression.  Social withdrawal.  Poor concentration. How is this diagnosed? This condition may be diagnosed based on a history of your symptoms. This condition is generally diagnosed if symptoms of PMS:  Are present in the 5 days before your period starts.  End within 4 days after your period starts.  Happen at least 3 months in a row.  Interfere with some of your normal activities. Other conditions that can cause some of these symptoms must be ruled out before PMS can be diagnosed. These include depression, anxiety, anemia, and thyroid problems. How is this treated? This condition may be treated  by:  Maintaining a healthy lifestyle. This includes eating a well-balanced diet and exercising regularly.  Taking medicines. Medicines can help relieve symptoms such as cramps, aches, pains, headaches, and breast tenderness. Depending on the severity of the condition, your health care provider may recommend various over-the-counter pain medicines. Follow these instructions at home: Eating and drinking   Eat a well-balanced diet.  Avoid caffeine and alcohol.  Limit the amount of salt and salty foods you eat. This will help reduce bloating.  Drink enough fluid to keep your urine pale yellow.  Take a multivitamin if told to do so by your health care provider. Lifestyle   Do not use any products that contain nicotine or tobacco, such as cigarettes, e-cigarettes, and chewing tobacco. If you need help quitting, ask your health care provider.  Exercise regularly as suggested by your health care provider.  Get enough sleep. For most adults, this is 7-8 hours of sleep each night.  Practice relaxation techniques such as yoga, tai chi, or meditation.  Find healthy ways to manage stress. General instructions   For 2-3 months, write down your symptoms, their severity, and how long they last. This will help your health care provider choose the best treatment for you.  Take over-the-counter and prescription medicines only as told by your health care provider.  If you are using birth control pills (oral contraceptives), use them as told by your health care provider. Contact a health care provider if:  Your symptoms get worse.  You develop new symptoms.  You have trouble doing your daily activities. Summary  Premenstrual syndrome (PMS) is a group of physical, emotional, and behavioral symptoms that   affect women of childbearing age.  PMS starts 1-2 weeks before the start of a woman's period and goes away a few days after the period starts.  PMS is treated by maintaining a healthy  lifestyle and taking medicines to relieve the symptoms. This information is not intended to replace advice given to you by your health care provider. Make sure you discuss any questions you have with your health care provider. Document Revised: 03/23/2018 Document Reviewed: 03/23/2018 Elsevier Patient Education  2020 [employer]vier Inc.  

## 2020-01-01 NOTE — Assessment & Plan Note (Signed)
Ongoing and improved.  Will continue Sertraline 25 MG daily as improvement noted with this PHQ9 decreased from 15 to 2 and GAD from 15 to 0.  Last visit sent 90 day supply with 3 refills, this will last until she is able to establish care with new provider in [place].  Follow-up as needed, has currently moved to [employer].

## 2020-03-13 DIAGNOSIS — R399 Unspecified symptoms and signs involving the genitourinary system: Secondary | ICD-10-CM | POA: Diagnosis not present

## 2020-03-15 ENCOUNTER — Other Ambulatory Visit: Payer: Self-pay | Admitting: Family Medicine

## 2020-03-15 NOTE — Telephone Encounter (Signed)
Copied from CRM [id number]. Topic: Quick Communication - Rx Refill/Question >> Mar 15, 2020 10:16 AM Jaquita Rector A wrote: Medication: sertraline (ZOLOFT) 25 MG tablet   Has the patient contacted their pharmacy? Yes.   (Agent: If no, request that the patient contact the pharmacy for the refill.) (Agent: If yes, when and what did the pharmacy advise?)  Preferred Pharmacy (with phone number or street name): [id number] - [place], [place] - [address]  Phone:  [phone] Fax:  [phone]     Agent: Please be advised that RX refills may take up to 3 business days. We ask that you follow-up with your pharmacy.

## 2020-03-21 DIAGNOSIS — R1031 Right lower quadrant pain: Secondary | ICD-10-CM | POA: Diagnosis not present

## 2020-04-03 DIAGNOSIS — N838 Other noninflammatory disorders of ovary, fallopian tube and broad ligament: Secondary | ICD-10-CM | POA: Diagnosis not present

## 2020-04-03 DIAGNOSIS — N938 Other specified abnormal uterine and vaginal bleeding: Secondary | ICD-10-CM | POA: Diagnosis not present

## 2020-04-03 DIAGNOSIS — N8300 Follicular cyst of ovary, unspecified side: Secondary | ICD-10-CM | POA: Diagnosis not present

## 2020-04-03 DIAGNOSIS — N888 Other specified noninflammatory disorders of cervix uteri: Secondary | ICD-10-CM | POA: Diagnosis not present

## 2020-04-03 DIAGNOSIS — Z6824 Body mass index (BMI) 24.0-24.9, adult: Secondary | ICD-10-CM | POA: Diagnosis not present

## 2020-04-03 DIAGNOSIS — N926 Irregular menstruation, unspecified: Secondary | ICD-10-CM | POA: Diagnosis not present

## 2020-04-18 DIAGNOSIS — N888 Other specified noninflammatory disorders of cervix uteri: Secondary | ICD-10-CM | POA: Diagnosis not present

## 2020-04-18 DIAGNOSIS — R002 Palpitations: Secondary | ICD-10-CM | POA: Diagnosis not present

## 2020-04-18 DIAGNOSIS — J4520 Mild intermittent asthma, uncomplicated: Secondary | ICD-10-CM | POA: Diagnosis not present

## 2020-04-18 DIAGNOSIS — N926 Irregular menstruation, unspecified: Secondary | ICD-10-CM | POA: Diagnosis not present

## 2020-04-18 DIAGNOSIS — Z13220 Encounter for screening for lipoid disorders: Secondary | ICD-10-CM | POA: Diagnosis not present

## 2020-05-19 DIAGNOSIS — Z20822 Contact with and (suspected) exposure to covid-19: Secondary | ICD-10-CM | POA: Diagnosis not present

## 2020-08-12 DIAGNOSIS — Z20822 Contact with and (suspected) exposure to covid-19: Secondary | ICD-10-CM | POA: Diagnosis not present

## 2020-08-12 DIAGNOSIS — J029 Acute pharyngitis, unspecified: Secondary | ICD-10-CM | POA: Diagnosis not present

## 2022-03-18 ENCOUNTER — Ambulatory Visit: Admit: 2022-03-18 | Discharge: 2022-03-18 | Payer: PRIVATE HEALTH INSURANCE

## 2022-03-18 DIAGNOSIS — N760 Acute vaginitis: Secondary | ICD-10-CM

## 2022-03-18 LAB — POCT URINE DIPSTICK
Bilirubin, UA: NEGATIVE
Blood, UA POC: NEGATIVE
Glucose, UA POC: NEGATIVE
Ketones, UA: NEGATIVE
Leukocytes, UA: NEGATIVE
Nitrite, UA: NEGATIVE
Protein, UA POC: NEGATIVE
Spec Grav, UA: 1.015
Urobilinogen, UA: 0.2
pH, UA: 7

## 2022-03-18 LAB — POCT URINE PREGNANCY: Preg Test, Ur: NEGATIVE

## 2022-03-18 NOTE — Patient Instructions (Signed)
Urinalysis was normal.    Urine pregnancy test was negative.    Diatherix testing sent to lab; treatment pending results; will call patient with results.     Abstain from sexual intercourse until diatherix results come back and treatment is complete.     Follow up with PCP/womens health or return to clinic if not improving.

## 2022-03-18 NOTE — Progress Notes (Signed)
[facility]   [facility]  [address]  [facility] [place] [address]  Dept: [phone]  Dept Fax: [phone]  Loc: [phone]    Alzora Ha is a 26 y.o. female who presents today for her medical conditions/complaints as noted below.  [name] is complaining of Vaginal Discharge and Vaginal Pain        HPI:   Pt presents with c/o vaginal discharge that is white, thick, and blood-tinged, vaginal discomfort/irritation x several days. Pt reports hx of vaginal infections. Denies vaginal itching, abdominal pain, dysuria, flank pain, exposure to STDs. Pt says she has been with the same sexual partner for some time and does not have concern about STDs. Pt says she takes her birth control pills consecutively so that she does not have a withdrawal bleed. No aggravating/alleviating factors. S/s moderate. stable    Vaginal Discharge  The patient's primary symptoms include vaginal discharge. The patient's pertinent negatives include no pelvic pain. Pertinent negatives include no abdominal pain, chills, diarrhea, dysuria, fever, flank pain, frequency, headaches, hematuria, nausea, rash, sore throat, urgency or vomiting.   Vaginal Pain  The patient's primary symptoms include vaginal discharge. The patient's pertinent negatives include no pelvic pain. Pertinent negatives include no abdominal pain, chills, diarrhea, dysuria, fever, flank pain, frequency, headaches, hematuria, nausea, rash, sore throat, urgency or vomiting.     Past Medical History:   Diagnosis Date    Interstitial cystitis     Ovarian cyst        History reviewed. No pertinent surgical history.    History reviewed. No pertinent family history.    Social History     Tobacco Use    Smoking status: Not on file    Smokeless tobacco: Not on file   Substance Use Topics    Alcohol use: Not on file        Current Outpatient Medications   Medication Sig Dispense Refill    LO LOESTRIN FE 1 MG-10 MCG / 10 MCG  tablet Take 1 tablet by mouth daily      sertraline (ZOLOFT) 50 MG tablet        No current facility-administered medications for this visit.       No Known Allergies    Health Maintenance   Topic Date Due    COVID-19 Vaccine (1) Never done    Varicella vaccine (1 of 2 - 2-dose childhood series) Never done    HPV vaccine (1 - 2-dose series) Never done    Depression Screen  Never done    HIV screen  Never done    Hepatitis C screen  Never done    DTaP/Tdap/Td vaccine (1 - Tdap) Never done    Pap smear  Never done    Flu vaccine (1) 03/24/2022    Hepatitis A vaccine  Aged Out    Hib vaccine  Aged Out    Meningococcal (ACWY) vaccine  Aged Out    Pneumococcal 0-64 years Vaccine  Aged Out       Subjective:   Review of Systems   Constitutional:  Negative for activity change, appetite change, chills, diaphoresis, fatigue, fever and unexpected weight change.   HENT:  Negative for congestion, dental problem, ear discharge, ear pain, facial swelling, hearing loss, postnasal drip, sinus pressure, sinus pain, sore throat and trouble swallowing.    Eyes:  Negative for pain, discharge, redness and visual disturbance.   Respiratory:  Negative for apnea, cough, shortness of breath and wheezing.  Cardiovascular:  Negative for chest pain and palpitations.   Gastrointestinal:  Negative for abdominal pain, diarrhea, nausea and vomiting.   Endocrine: Negative for polydipsia, polyphagia and polyuria.   Genitourinary:  Positive for vaginal discharge and vaginal pain (discomfort/irritation). Negative for decreased urine volume, difficulty urinating, dyspareunia, dysuria, enuresis, flank pain, frequency, genital sores, hematuria, menstrual problem, pelvic pain, urgency and vaginal bleeding.   Musculoskeletal:  Negative for arthralgias, joint swelling and myalgias.   Skin:  Negative for color change, rash and wound.   Allergic/Immunologic: Negative for immunocompromised state.   Neurological:  Negative for dizziness, seizures, syncope,  numbness and headaches.   Hematological:  Negative for adenopathy. Does not bruise/bleed easily.   Psychiatric/Behavioral:  Negative for confusion and hallucinations.    All other systems reviewed and are negative.    Objective    Physical Exam  Vitals and nursing note reviewed.   Constitutional:       General: She is not in acute distress.     Appearance: Normal appearance. She is normal weight. She is not ill-appearing, toxic-appearing or diaphoretic.   HENT:      Head: Normocephalic and atraumatic.      Right Ear: External ear normal.      Left Ear: External ear normal.      Mouth/Throat:      Mouth: Mucous membranes are moist.      Pharynx: Oropharynx is clear.   Eyes:      Extraocular Movements: Extraocular movements intact.      Conjunctiva/sclera: Conjunctivae normal.      Pupils: Pupils are equal, round, and reactive to light.   Cardiovascular:      Rate and Rhythm: Normal rate and regular rhythm.      Pulses: Normal pulses.      Heart sounds: Normal heart sounds. No murmur heard.    No friction rub. No gallop.   Pulmonary:      Effort: Pulmonary effort is normal. No respiratory distress.      Breath sounds: Normal breath sounds. No stridor. No wheezing, rhonchi or rales.   Chest:      Chest wall: No tenderness.   Abdominal:      General: Abdomen is flat. Bowel sounds are normal. There is no distension.      Palpations: Abdomen is soft. There is no mass.      Tenderness: There is no abdominal tenderness. There is no guarding or rebound.      Hernia: No hernia is present.   Musculoskeletal:         General: Normal range of motion.      Cervical back: Normal range of motion.   Skin:     General: Skin is warm.      Capillary Refill: Capillary refill takes less than 2 seconds.      Coloration: Skin is not pale.      Findings: No erythema or rash.   Neurological:      General: No focal deficit present.      Mental Status: She is alert and oriented to person, place, and time.      Deep Tendon Reflexes: Reflexes  are normal and symmetric.   Psychiatric:         Attention and Perception: Attention normal.         Mood and Affect: Mood normal.         Behavior: Behavior normal. Behavior is cooperative.         Thought Content: Thought content normal.  BP 110/70   Pulse 70   Temp 98.4 F (36.9 C)   Resp 18   Wt 175 lb (79.4 kg)   LMP  (LMP Unknown)   SpO2 99%     Assessment         Diagnosis Orders   1. Vaginal infection        2. Vaginal discharge  POCT Urinalysis no Micro      3. Vaginal irritation  POCT Urinalysis no Micro      4. Missed period  POCT urine pregnancy          Plan   Urinalysis was WNL.    Urine pregnancy test was negative.    Diatherix testing sent to lab; treatment pending results; will call patient with results.     Abstain from sexual intercourse until diatherix results come back and treatment is complete.     Follow up with PCP/womens health or return to clinic if not improving.    Pt demonstrates understanding and agrees with treatment plan.      Orders Placed This Encounter   Procedures    POCT Urinalysis no Micro    POCT urine pregnancy       Results for orders placed or performed in visit on 03/18/22   POCT Urinalysis no Micro   Result Value Ref Range    Color, UA YELLOW     Clarity, UA CLEAR     Glucose, UA POC NEG     Bilirubin, UA NEG     Ketones, UA NEG     Spec Grav, UA 1.015     Blood, UA POC NEG     pH, UA 7.0     Protein, UA POC NEG     Urobilinogen, UA 0.2     Leukocytes, UA NEG     Nitrite, UA NEG     Appearance, Fluid Clear Clear, Slightly Cloudy   POCT urine pregnancy   Result Value Ref Range    Preg Test, Ur NEGATIVE     Control PRESENT        No orders of the defined types were placed in this encounter.     New Prescriptions    No medications on file        Return if symptoms worsen or fail to improve.         Discussed use, benefits, and side effects of any prescribed medications. All patient questions were answered. Patient voiced understanding of care plan.   Patient was  given educational materials - see patient instructions below.     Patient Instructions   Urinalysis was normal.    Urine pregnancy test was negative.    Diatherix testing sent to lab; treatment pending results; will call patient with results.     Abstain from sexual intercourse until diatherix results come back and treatment is complete.     Follow up with PCP/womens health or return to clinic if not improving.      Electronically signed by Johnell Comings, APRN - CNP on 03/18/2022 at 7:15 PM

## 2022-03-20 ENCOUNTER — Encounter

## 2022-03-20 MED ORDER — METRONIDAZOLE 500 MG PO TABS
500 MG | ORAL_TABLET | Freq: Two times a day (BID) | ORAL | 0 refills | Status: AC
Start: 2022-03-20 — End: 2022-03-27

## 2022-03-20 MED ORDER — DOXYCYCLINE HYCLATE 100 MG PO TABS
100 MG | ORAL_TABLET | Freq: Two times a day (BID) | ORAL | 0 refills | Status: AC
Start: 2022-03-20 — End: 2022-03-30

## 2022-04-10 ENCOUNTER — Ambulatory Visit: Admit: 2022-04-10 | Discharge: 2022-04-10 | Payer: PRIVATE HEALTH INSURANCE | Attending: Family

## 2022-04-10 DIAGNOSIS — J020 Streptococcal pharyngitis: Secondary | ICD-10-CM

## 2022-04-10 LAB — POCT RAPID STREP A: Strep A Ag: POSITIVE — AB

## 2022-04-10 MED ORDER — AMOXICILLIN-POT CLAVULANATE 875-125 MG PO TABS
875-125 MG | ORAL_TABLET | Freq: Two times a day (BID) | ORAL | 0 refills | Status: AC
Start: 2022-04-10 — End: 2022-04-20

## 2022-04-10 NOTE — Progress Notes (Signed)
[facility]  [facility]  [address]  [facility] [place] [address]  Dept: [phone]  Dept Fax: [phone]  Loc: [phone]    Susan Walls is a 26 y.o. female who presents today for her medical conditions/complaints as noted below.  Susan Walls is complaining of Headache, Congestion (Sinus/chest), and Pharyngitis        HPI:   Headache  Pharyngitis  Associated symptoms include congestion, headaches and a sore throat. Pertinent negatives include no abdominal pain, arthralgias, chest pain, chills, coughing, fatigue, fever, neck pain, numbness, rash or weakness.     Susan Walls presents to the office complaining of sore throat, congestion, and headache.  Symptoms started a few days ago.  Denies fever or GI upset.  Patient was treated for BV recently.     Past Medical History:   Diagnosis Date    Interstitial cystitis     Ovarian cyst        No past surgical history on file.    No family history on file.    Social History     Tobacco Use    Smoking status: Not on file    Smokeless tobacco: Not on file   Substance Use Topics    Alcohol use: Not on file        Current Outpatient Medications   Medication Sig Dispense Refill    amoxicillin-clavulanate (AUGMENTIN) 875-125 MG per tablet Take 1 tablet by mouth 2 times daily for 10 days 20 tablet 0    LO LOESTRIN FE 1 MG-10 MCG / 10 MCG tablet Take 1 tablet by mouth daily      sertraline (ZOLOFT) 50 MG tablet        No current facility-administered medications for this visit.       No Known Allergies    Health Maintenance   Topic Date Due    COVID-19 Vaccine (1) Never done    Varicella vaccine (1 of 2 - 2-dose childhood series) Never done    HPV vaccine (1 - 2-dose series) Never done    Depression Screen  Never done    HIV screen  Never done    Hepatitis C screen  Never done    DTaP/Tdap/Td vaccine (1 - Tdap) Never done    Pap smear  Never done    Flu vaccine (1) Never done    Hepatitis A vaccine  Aged Out    Hib vaccine   Aged Out    Meningococcal (ACWY) vaccine  Aged Out    Pneumococcal 0-64 years Vaccine  Aged Out       Subjective:   Review of Systems   Constitutional:  Negative for chills, fatigue and fever.   HENT:  Positive for congestion and sore throat. Negative for sinus pressure and trouble swallowing.    Eyes:  Negative for pain and discharge.   Respiratory:  Negative for cough, chest tightness, shortness of breath, wheezing and stridor.    Cardiovascular:  Negative for chest pain and palpitations.   Gastrointestinal:  Negative for abdominal distention and abdominal pain.   Genitourinary:  Negative for difficulty urinating, dysuria and hematuria.   Musculoskeletal:  Negative for arthralgias, neck pain and neck stiffness.   Skin:  Negative for color change and rash.   Neurological:  Positive for headaches. Negative for dizziness, syncope, speech difficulty, weakness and numbness.   Psychiatric/Behavioral:  Negative for confusion and suicidal ideas.      Objective    Physical Exam  Vitals and nursing note reviewed.   Constitutional:       General: She is not in acute distress.     Appearance: Normal appearance.   HENT:      Head: Normocephalic.      Right Ear: Tympanic membrane, ear canal and external ear normal.      Left Ear: Tympanic membrane, ear canal and external ear normal.      Nose: Nose normal. No congestion.      Mouth/Throat:      Mouth: Mucous membranes are moist.      Pharynx: Oropharynx is clear. Posterior oropharyngeal erythema (moderate) present.   Eyes:      Conjunctiva/sclera: Conjunctivae normal.      Pupils: Pupils are equal, round, and reactive to light.   Cardiovascular:      Rate and Rhythm: Normal rate and regular rhythm.      Pulses: Normal pulses.      Heart sounds: Normal heart sounds. No murmur heard.  Pulmonary:      Effort: Pulmonary effort is normal. No respiratory distress.      Breath sounds: Normal breath sounds. No stridor. No wheezing.   Abdominal:      General: Abdomen is flat. Bowel  sounds are normal. There is no distension.      Tenderness: There is no abdominal tenderness.   Musculoskeletal:         General: No swelling or deformity. Normal range of motion.      Cervical back: Normal range of motion. No rigidity or tenderness.   Skin:     General: Skin is warm and dry.      Findings: No rash.   Neurological:      General: No focal deficit present.      Mental Status: She is alert and oriented to person, place, and time.      Sensory: No sensory deficit.       BP 124/70   Pulse 75   Temp 97.8 F (36.6 C)   Resp 20   Wt 175 lb (79.4 kg)   LMP  (LMP Unknown)   SpO2 99%     Assessment         Diagnosis Orders   1. Strep pharyngitis  amoxicillin-clavulanate (AUGMENTIN) 875-125 MG per tablet      2. Sore throat  POCT rapid strep A          Plan   Encourage fluids, Tylenol/Ibuprofen, OTC decongestants   Strep positive  Antibiotic sent to pharmacy take with probiotic.  Change toothbrush after 24 hours on antibiotics.  If symptoms worsen or fail to improve follow-up with PCP  If SOB, chest pain, or high persistent fevers occur, go to ER    Patient verbalized understanding and agrees to plan  Orders Placed This Encounter   Procedures    POCT rapid strep A       Results for orders placed or performed in visit on 04/10/22   POCT rapid strep A   Result Value Ref Range    Strep A Ag Positive (A) None Detected       Orders Placed This Encounter   Medications    amoxicillin-clavulanate (AUGMENTIN) 875-125 MG per tablet     Sig: Take 1 tablet by mouth 2 times daily for 10 days     Dispense:  20 tablet     Refill:  0      New Prescriptions    AMOXICILLIN-CLAVULANATE (AUGMENTIN) 875-125 MG PER TABLET  Take 1 tablet by mouth 2 times daily for 10 days        No follow-ups on file.         Discussed use, benefits, and side effects of any prescribed medications. All patient questions were answered. Patient voiced understanding of care plan.   Patient was given educational materials - see patient  instructions below.     Patient Instructions   Encourage fluids, Tylenol/Ibuprofen, OTC decongestants   Strep positive  Antibiotic sent to pharmacy take with probiotic.  Change toothbrush after 24 hours on antibiotics.  If symptoms worsen or fail to improve follow-up with PCP  If SOB, chest pain, or high persistent fevers occur, go to ER    Patient verbalized understanding and agrees to plan        Electronically signed by Gretta Began, APRN - CNP on 04/10/2022 at 6:34 PM

## 2022-04-10 NOTE — Patient Instructions (Signed)
Encourage fluids, Tylenol/Ibuprofen, OTC decongestants   Strep positive  Antibiotic sent to pharmacy take with probiotic.  Change toothbrush after 24 hours on antibiotics.  If symptoms worsen or fail to improve follow-up with PCP  If SOB, chest pain, or high persistent fevers occur, go to ER    Patient verbalized understanding and agrees to plan

## 2022-04-14 DIAGNOSIS — R197 Diarrhea, unspecified: Secondary | ICD-10-CM

## 2022-04-14 NOTE — Discharge Instructions (Signed)
Drink plenty of fluids.  Take your antibiotic with food.  Use your prescription of Zofran as needed.   Return to ER for new or worsening symptoms.

## 2022-04-14 NOTE — ED Provider Notes (Signed)
[facility]  eMERGENCY dEPARTMENT eNCOUnter      Pt Name: Susan Walls  MRN: [id number]  Birthdate 07-02-1996  Date of evaluation: 04/14/2022  Provider: Lowella Dell, APRN - NP    CHIEF COMPLAINT       Chief Complaint   Patient presents with    Diarrhea           Rectal Bleeding     Bright red         HISTORY OF PRESENT ILLNESS   (Location/Symptom, Timing/Onset,Context/Setting, Quality, Duration, Modifying Factors, Severity)  Note limiting factors.       26 y.o. non-toxic appearing female presented to ER with complaint of diarrhea with red blood.  She denies abdominal pain aside from cramping with episodes of diarrhea. She estimates approximately one tablespoon of blood in a "clump".  She is currently taking Augmentin for strep pharyngitis and has had diarrhea since starting the medication. She denies fever, chills, or vomiting but endorses nausea especially when taking antibiotic. Her last meal was dinner and she had Ziti pasta.     The history is provided by the patient and a significant other.     NursingNotes were reviewed.    REVIEW OF SYSTEMS    (2-9 systems for level 4, 10 or more for level 5)     Review of Systems   Constitutional:  Positive for fatigue. Negative for chills and fever.   HENT:  Positive for sore throat.    Respiratory: Negative.     Cardiovascular: Negative.    Gastrointestinal:  Positive for blood in stool, diarrhea and nausea. Negative for abdominal distention.   Neurological:  Negative for dizziness and light-headedness.   All other systems reviewed and are negative.         PAST MEDICALHISTORY     Past Medical History:   Diagnosis Date    Interstitial cystitis     Ovarian cyst          SURGICAL HISTORY     History reviewed. No pertinent surgical history.      CURRENT MEDICATIONS     Discharge Medication List as of 04/14/2022 11:46 PM        CONTINUE these medications which have NOT CHANGED    Details   amoxicillin-clavulanate (AUGMENTIN) 875-125 MG per tablet Take 1 tablet by  mouth 2 times daily for 10 days, Disp-20 tablet, R-0Normal      LO LOESTRIN FE 1 MG-10 MCG / 10 MCG tablet Take 1 tablet by mouth daily, DAWHistorical Med      sertraline (ZOLOFT) 50 MG tablet Historical Med             ALLERGIES     Patient has no known allergies.    FAMILY HISTORY     History reviewed. No pertinent family history.       SOCIAL HISTORY       Social History     Socioeconomic History    Marital status: Single     Spouse name: None    Number of children: None    Years of education: None    Highest education level: None   Tobacco Use    Smoking status: Never    Smokeless tobacco: Never   Substance and Sexual Activity    Alcohol use: Yes    Drug use: Never       SCREENINGS    Glasgow Coma Scale  Eye Opening: Spontaneous  Best Verbal Response: Oriented  Best Motor Response: Obeys  commands  Glasgow Coma Scale Score: 15        PHYSICAL EXAM    (up to 7 for level 4, 8 or more for level 5)     ED Triage Vitals [04/14/22 2252]   BP Temp Temp src Pulse Respirations SpO2 Height Weight - Scale   132/77 97.7 F (36.5 C) -- (!) 103 18 98 % -- 175 lb (79.4 kg)       Physical Exam  Vitals and nursing note reviewed. Exam conducted with a chaperone present.   Constitutional:       General: She is not in acute distress.     Appearance: She is normal weight. She is not ill-appearing.   HENT:      Mouth/Throat:      Mouth: Mucous membranes are moist.      Pharynx: Posterior oropharyngeal erythema present. No oropharyngeal exudate.   Eyes:      Pupils: Pupils are equal, round, and reactive to light.   Cardiovascular:      Rate and Rhythm: Regular rhythm. Tachycardia present.      Pulses: Normal pulses.      Heart sounds: Normal heart sounds.      Comments: Rate 103.  Pulmonary:      Effort: Pulmonary effort is normal.   Abdominal:      General: Abdomen is flat. Bowel sounds are normal.      Palpations: Abdomen is soft.      Tenderness: There is no abdominal tenderness. There is no guarding or rebound.   Genitourinary:      Comments: Rectal exam reveals no blood at anus and no evidence of internal/external hemorrhoid or fissure. Hemoccult is negative for blood.    Musculoskeletal:         General: Normal range of motion.   Skin:     General: Skin is warm and dry.      Capillary Refill: Capillary refill takes less than 2 seconds.      Coloration: Skin is not pale.      Findings: No rash.      Comments: Sclera and oral mucosa pink.   Neurological:      Mental Status: She is alert and oriented to person, place, and time.       DIAGNOSTIC RESULTS     EKG: All EKG's areinterpreted by the Emergency Department Physician who either signs or Co-signs this chart in the absence of a cardiologist.        RADIOLOGY:  Non-plain film images such as CT, Ultrasound and MRI are read by the radiologist. Plain radiographic images are visualized and preliminarily interpreted bythe emergency physician with the below findings:          No orders to display           LABS:  Labs Reviewed - No data to display    All other labs were within normal range or not returned as of this dictation.    EMERGENCY DEPARTMENT COURSE and DIFFERENTIAL DIAGNOSIS/MDM:   Vitals:    Vitals:    04/14/22 2252   BP: 132/77   Pulse: (!) 103   Resp: 18   Temp: 97.7 F (36.5 C)   SpO2: 98%   Weight: 175 lb (79.4 kg)       MDM  Number of Diagnoses or Management Options  Diarrhea, unspecified type  Diagnosis management comments: 26 year old female presents to ER with report diarrhea that contained blood.  She reports that there was red in  the toilet and an approximate teaspoon "clump" of blood.  She was recently started on Augmentin for strep pharyngitis and has had diarrhea since beginning the medication. She has crampy abdominal pain with episodes of diarrhea but no tenderness to palpation. Bowel sounds are positive in all quadrants.  Differentials include gastroenteritis, medication side effects.  Rectal exam revealed no evidence of blood, internal or external hemorrhoids, no  fissure and hemoccult was negative for blood.  Multiple offers were made to obtain labwork and further evaluation; patient declined and stated that she would rather go home. Suspect that red noted in toilet may have been due to tomato products in her dinner and diarrhea due to antibiotic use. Roberts Gaudy RN present at bedside for rectal exam. Patient and her significant other are agreeable to discharge with instructions to return immediately for new or worsening symptoms. Encouraged patient to take antibiotics with food.         Reassessment  No change in condition.     CONSULTS:  None    PROCEDURES:  Unless otherwise noted below, none     Procedures    FINAL IMPRESSION      1. Diarrhea, unspecified type          DISPOSITION/PLAN   DISPOSITION Decision To Discharge 04/14/2022 11:42:55 PM      PATIENT REFERRED TO:  No follow-up provider specified.    DISCHARGE MEDICATIONS:  Discharge Medication List as of 04/14/2022 11:46 PM             (Please note that portions of this note were completed with a voice recognition program.  Efforts were made to edit thedictations but occasionally words are mis-transcribed.)    Lowella Dell, APRN - NP (electronically signed)  Attending Emergency Physician        Lowella Dell, APRN - NP  04/15/22 [phone]

## 2022-04-15 ENCOUNTER — Inpatient Hospital Stay
Admit: 2022-04-15 | Discharge: 2022-04-15 | Disposition: A | Discharge status: Home / Self Care | Payer: PRIVATE HEALTH INSURANCE

## 2022-04-15 ENCOUNTER — Emergency Department: Admit: 2022-04-15 | Payer: PRIVATE HEALTH INSURANCE

## 2022-04-15 DIAGNOSIS — K529 Noninfective gastroenteritis and colitis, unspecified: Secondary | ICD-10-CM

## 2022-04-15 LAB — CBC WITH AUTO DIFFERENTIAL
Basophils %: 0.3 % (ref 0.0–1.0)
Basophils Absolute: 0 10*3/uL (ref 0.00–0.20)
Eosinophils %: 1 % (ref 0.0–5.0)
Eosinophils Absolute: 0.1 10*3/uL (ref 0.00–0.60)
Hematocrit: 37.1 % (ref 37.0–47.0)
Hemoglobin: 12.8 g/dL (ref 12.0–16.0)
Immature Granulocytes #: 0 10*3/uL
Lymphocytes %: 19 % — ABNORMAL LOW (ref 20.0–40.0)
Lymphocytes Absolute: 1.2 10*3/uL (ref 1.1–4.5)
MCH: 32.1 pg — ABNORMAL HIGH (ref 27.0–31.0)
MCHC: 34.5 g/dL (ref 33.0–37.0)
MCV: 93 fL (ref 81.0–99.0)
MPV: 10.7 fL (ref 9.4–12.3)
Monocytes %: 10.9 % — ABNORMAL HIGH (ref 0.0–10.0)
Monocytes Absolute: 0.7 10*3/uL (ref 0.00–0.90)
Neutrophils %: 68.6 % — ABNORMAL HIGH (ref 50.0–65.0)
Neutrophils Absolute: 4.3 10*3/uL (ref 1.5–7.5)
Platelets: 155 10*3/uL (ref 130–400)
RBC: 3.99 M/uL — ABNORMAL LOW (ref 4.20–5.40)
RDW: 11.6 % (ref 11.5–14.5)
WBC: 6.2 10*3/uL (ref 4.8–10.8)

## 2022-04-15 LAB — URINALYSIS WITH REFLEX TO CULTURE
Bilirubin Urine: NEGATIVE
Blood, Urine: NEGATIVE
Glucose, Ur: NEGATIVE mg/dL
Ketones, Urine: NEGATIVE mg/dL
Leukocyte Esterase, Urine: NEGATIVE
Nitrite, Urine: NEGATIVE
Protein, UA: NEGATIVE mg/dL
Specific Gravity, UA: 1.004 (ref 1.005–1.030)
Urobilinogen, Urine: 0.2 E.U./dL (ref <2.0)
pH, UA: 7 (ref 5.0–8.0)

## 2022-04-15 LAB — COMPREHENSIVE METABOLIC PANEL
ALT: 17 U/L (ref 5–33)
AST: 16 U/L (ref 5–32)
Albumin: 4.1 g/dL (ref 3.5–5.2)
Alkaline Phosphatase: 49 U/L (ref 35–104)
Anion Gap: 9 mmol/L (ref 7–19)
BUN: 8 mg/dL (ref 6–20)
CO2: 28 mmol/L (ref 22–29)
Calcium: 9.1 mg/dL (ref 8.6–10.0)
Chloride: 102 mmol/L (ref 98–111)
Creatinine: 0.7 mg/dL (ref 0.5–0.9)
Est, Glom Filt Rate: >60 (ref >60)
Glucose: 92 mg/dL (ref 74–109)
Potassium: 4 mmol/L (ref 3.5–5.0)
Sodium: 139 mmol/L (ref 136–145)
Total Bilirubin: 0.3 mg/dL (ref 0.2–1.2)
Total Protein: 6.9 g/dL (ref 6.6–8.7)

## 2022-04-15 LAB — HCG, SERUM, QUALITATIVE: hCG Qual: NEGATIVE

## 2022-04-15 MED ORDER — IOPAMIDOL 76 % IV SOLN
76 % | Freq: Once | INTRAVENOUS | Status: AC | PRN
Start: 2022-04-15 — End: 2022-04-15
  Administered 2022-04-15: 16:00:00 70 mL via INTRAVENOUS

## 2022-04-15 MED ORDER — ONDANSETRON 4 MG PO TBDP
4 MG | ORAL_TABLET | Freq: Three times a day (TID) | ORAL | 0 refills | Status: DC | PRN
Start: 2022-04-15 — End: 2022-06-29

## 2022-04-15 NOTE — ED Provider Notes (Signed)
[facility]  EMERGENCY DEPARTMENT ENCOUNTER      Pt Name: Susan Walls  MRN: [id number]  Birthdate 1996-03-29  Date of evaluation: 04/15/2022  Provider: Tyson Alias, APRN - CNP    CHIEF COMPLAINT       Chief Complaint   Patient presents with    Diarrhea     Diarrhea with bright red blood, seen here last night but bleeding is worse today         HISTORY OF PRESENT ILLNESS   (Location/Symptom, Timing/Onset, Context/Setting, Quality, Duration, Modifying Factors, Severity)  Note limiting factors.   Susan Walls is a 26 y.o. female who presents to the emergency department with concern for abdominal pain and diarrhea. No fever. No chills. Reports nausea and diarrhea. She is on Augmentin for recent positive strep swab. Feels like those symptoms have improved. She was seen last night and had negative hemmocult after there was some concern for blood in her stool.     ER chart from last night reviewed.     HPI    Nursing Notes were reviewed.    REVIEW OF SYSTEMS    (2-9 systems for level 4, 10 or more for level 5)     Review of Systems   Constitutional:  Negative for chills and fever.   HENT:  Negative for congestion.    Respiratory:  Negative for cough and shortness of breath.    Cardiovascular:  Negative for chest pain and palpitations.   Gastrointestinal:  Positive for abdominal pain, diarrhea and nausea.   Genitourinary:  Negative for dysuria, flank pain, frequency and urgency.   Musculoskeletal:  Negative for back pain and neck pain.   Neurological:  Negative for dizziness, syncope, weakness, light-headedness and headaches.     Except as noted above the remainder of the review of systems was reviewed and negative.       PAST MEDICAL HISTORY     Past Medical History:   Diagnosis Date    Interstitial cystitis     Ovarian cyst          SURGICAL HISTORY     History reviewed. No pertinent surgical history.      CURRENT MEDICATIONS       Discharge Medication List as of 04/15/2022  1:05 PM        CONTINUE these  medications which have NOT CHANGED    Details   amoxicillin-clavulanate (AUGMENTIN) 875-125 MG per tablet Take 1 tablet by mouth 2 times daily for 10 days, Disp-20 tablet, R-0Normal      LO LOESTRIN FE 1 MG-10 MCG / 10 MCG tablet Take 1 tablet by mouth daily, DAWHistorical Med      sertraline (ZOLOFT) 50 MG tablet Historical Med             ALLERGIES     Patient has no known allergies.    FAMILY HISTORY     History reviewed. No pertinent family history.       SOCIAL HISTORY       Social History     Socioeconomic History    Marital status: Single     Spouse name: None    Number of children: None    Years of education: None    Highest education level: None   Tobacco Use    Smoking status: Never    Smokeless tobacco: Never   Substance and Sexual Activity    Alcohol use: Yes    Drug use: Never  SCREENINGS         Glasgow Coma Scale  Eye Opening: Spontaneous  Best Verbal Response: Oriented  Best Motor Response: Obeys commands  Glasgow Coma Scale Score: 15                     CIWA Assessment  BP: (!) 141/99  Pulse: 85                 PHYSICAL EXAM    (up to 7 for level 4, 8 or more for level 5)     ED Triage Vitals [04/15/22 0904]   BP Temp Temp Source Pulse Respirations SpO2 Height Weight   (!) 141/99 98.8 F (37.1 C) Oral 85 17 100 % -- --       Physical Exam  Vitals reviewed.   Constitutional:       General: She is not in acute distress.     Appearance: Normal appearance. She is not ill-appearing, toxic-appearing or diaphoretic.   HENT:      Head: Normocephalic and atraumatic.      Nose: Nose normal.      Mouth/Throat:      Mouth: Mucous membranes are moist.      Pharynx: Oropharynx is clear.   Eyes:      Extraocular Movements: Extraocular movements intact.      Conjunctiva/sclera: Conjunctivae normal.      Pupils: Pupils are equal, round, and reactive to light.   Cardiovascular:      Rate and Rhythm: Normal rate and regular rhythm.      Pulses: Normal pulses.      Heart sounds: Normal heart sounds.   Pulmonary:       Effort: Pulmonary effort is normal.      Breath sounds: Normal breath sounds.   Abdominal:      General: Bowel sounds are normal. There is no distension.      Palpations: Abdomen is soft.      Tenderness: There is abdominal tenderness (ruq rlq). There is no right CVA tenderness, left CVA tenderness or guarding.   Musculoskeletal:         General: Normal range of motion.      Cervical back: Neck supple.   Skin:     General: Skin is warm and dry.      Capillary Refill: Capillary refill takes less than 2 seconds.   Neurological:      General: No focal deficit present.      Mental Status: She is alert and oriented to person, place, and time.       DIAGNOSTIC RESULTS     EKG: All EKG's are interpreted by the Emergency Department Physician who either signs or Co-signs this chart in the absence of a cardiologist.        RADIOLOGY:   Non-plain film images such as CT, Ultrasound and MRI are read by the radiologist. Plain radiographic images are visualized and preliminarily interpreted by the emergency physician with the below findings:        Interpretation per the Radiologist below, if available at the time of this note:    CT ABDOMEN PELVIS W IV CONTRAST Additional Contrast? None   Final Result   Diffuse wall thickening in the right-sided colon and hepatic flexure and mild surrounding soft tissue stranding.  Findings are suggestive of right-sided colitis.  Clinical correlation advised.  Appendix is not identified.        All CT scans are performed using dose optimization  techniques as appropriate to the performed exam and include    at least one of the following: Automated exposure control, adjustment of the mA and/or kV according to size, and the use of iterative reconstruction technique.        ______________________________________    Electronically signed by: Luberta Robertson M.D.   Date:     04/15/2022   Time:    11:50             ED BEDSIDE ULTRASOUND:   Performed by ED Physician - none    LABS:  Labs Reviewed    CBC WITH AUTO DIFFERENTIAL - Abnormal; Notable for the following components:       Result Value    RBC 3.99 (*)     MCH 32.1 (*)     Neutrophils % 68.6 (*)     Lymphocytes % 19.0 (*)     Monocytes % 10.9 (*)     All other components within normal limits   COMPREHENSIVE METABOLIC PANEL   URINALYSIS WITH REFLEX TO CULTURE   HCG, SERUM, QUALITATIVE       All other labs were within normal range or not returned as of this dictation.    EMERGENCY DEPARTMENT COURSE and DIFFERENTIAL DIAGNOSIS/MDM:   Vitals:    Vitals:    04/15/22 0904   BP: (!) 141/99   Pulse: 85   Resp: 17   Temp: 98.8 F (37.1 C)   TempSrc: Oral   SpO2: 100%           Medical Decision Making  Amount and/or Complexity of Data Reviewed  External Data Reviewed: notes.  Labs: ordered. Decision-making details documented in ED Course.  Radiology: ordered. Decision-making details documented in ED Course.    Risk  Prescription drug management.            REASSESSMENT          CRITICAL CARE TIME       CONSULTS:  None    PROCEDURES:  Unless otherwise noted below, none     Procedures         FINAL IMPRESSION      1. Colitis          DISPOSITION/PLAN   DISPOSITION Decision To Discharge 04/15/2022 12:56:15 PM      PATIENT REFERRED TO:  [facility]  [address]  [place] [address]  [phone]  Call in 2 days        DISCHARGE MEDICATIONS:  Discharge Medication List as of 04/15/2022  1:05 PM        START taking these medications    Details   ondansetron (ZOFRAN-ODT) 4 MG disintegrating tablet Take 1 tablet by mouth 3 times daily as needed for Nausea or Vomiting, Disp-21 tablet, R-0Normal           Controlled Substances Monitoring:     No flowsheet data found.    (Please note that portions of this note were completed with a voice recognition program.  Efforts were made to edit the dictations but occasionally words are mis-transcribed.)    Edita Weyenberg Jacklynn Bue, APRN - CNP (electronically signed)  Attending Emergency Physician            Tyson Alias, APRN - CNP  04/15/22 1622

## 2022-05-11 ENCOUNTER — Ambulatory Visit: Admit: 2022-05-11 | Discharge: 2022-05-11 | Payer: PRIVATE HEALTH INSURANCE | Attending: Acute Care

## 2022-05-11 DIAGNOSIS — E282 Polycystic ovarian syndrome: Secondary | ICD-10-CM

## 2022-05-11 MED ORDER — LO LOESTRIN FE 1 MG-10 MCG / 10 MCG PO TABS
1 | PACK | Freq: Every day | ORAL | 3 refills | Status: DC
Start: 2022-05-11 — End: 2022-08-31

## 2022-05-11 NOTE — Assessment & Plan Note (Signed)
Prn azo

## 2022-05-11 NOTE — Progress Notes (Signed)
[facility]  [facility] INTERNAL MEDICINE  [address]  [id number]  [facility] [place] [address]  Dept: [phone]  Dept Fax: [phone]  Loc: [phone]    Susan Walls (DOB:  1995-09-16) is a 26 y.o. female,NEW  patient  with Susan Walls, here for evaluation of the following chief complaint(s): Establish Care      Susan Walls is a 26 y.o. female who presents today for her medical conditions/complaints as noted below.  Susan Walls is c/oof Establish Care        HPI:     Chief Complaint   Patient presents with    Establish Care     @pt  is alone  she  just moved here from to be with her boyfriend  she has never been pregnant and doesn't intend to have children   HPI    2 REcent ER visits with rectal bleeding;   hgb was normal both time;  felt to have colitis;  this resolved on its own; thought to be from something she ate   PCOS;  hormone levels were not   3.  Insterstitial cystitis;  she is on azo as needed   4.  PMDD;  stable with BCP's and zoloft   she had from previous provider;    Past Medical History:   Diagnosis Date    Interstitial cystitis     Ovarian cyst       Past Surgical History:   Procedure Laterality Date    TONSILLECTOMY             05/11/2022     8:23 AM 04/15/2022     9:04 AM 04/14/2022    10:52 PM 04/10/2022     6:17 PM 03/18/2022     6:30 PM   Vitals   SYSTOLIC 108 141 03/20/2022 124 110   DIASTOLIC 60 99 77 70 70   Pulse 90 85 103 75 70   Temp  98.8 F (37.1 C) 97.7 F (36.5 C) 97.8 F (36.6 C) 98.4 F (36.9 C)   Respirations  17 18 20 18    SpO2 99 % 100 % 98 % 99 % 99 %   Weight - Scale 175 lb  175 lb 175 lb 175 lb   Height 5\' 7"        Body Mass Index 27.41 kg/m2           No family history on file.    Social History     Tobacco Use    Smoking status: Never    Smokeless tobacco: Never   Substance Use Topics    Alcohol use: Yes      Current Outpatient Medications   Medication Sig Dispense Refill    LO LOESTRIN FE 1 MG-10 MCG / 10 MCG tablet Take 1 tablet by mouth daily  1 packet 3    sertraline (ZOLOFT) 50 MG tablet       ondansetron (ZOFRAN-ODT) 4 MG disintegrating tablet Take 1 tablet by mouth 3 times daily as needed for Nausea or Vomiting 21 tablet 0     No current facility-administered medications for this visit.     No Known Allergies    Health Maintenance   Topic Date Due    HIV screen  Never done    Hepatitis C screen  Never done    Pap smear  Never done    DTaP/Tdap/Td vaccine (1 - Tdap) 06/01/2022 (Originally 03/06/2016)    HPV vaccine (1 - 2-dose series)  07/05/2022 (Originally 03/06/2008)    Hepatitis B vaccine (1 of 3 - 3-dose series) 05/12/2023 (Originally 1997[birth year]02/14)    Flu vaccine (1) 05/12/2023 (Originally 03/24/2022)    COVID-19 Vaccine (1) 05/08/2024 (Originally 09/06/1997)    Depression Monitoring  05/12/2023    Hepatitis A vaccine  Aged Out    Hib vaccine  Aged Out    Meningococcal (ACWY) vaccine  Aged Out    Pneumococcal 0-64 years Vaccine  Aged Out    Varicella vaccine  Discontinued    Depression Screen  Discontinued       No results found for: "LABA1C"  No results found for: "PSA", "PSADIA"  No results found for: "TSH"]  Lab Results   Component Value Date    NA 139 04/15/2022    K 4.0 04/15/2022    CL 102 04/15/2022    CO2 28 04/15/2022    BUN 8 04/15/2022    CREATININE 0.7 04/15/2022    GLUCOSE 92 04/15/2022    CALCIUM 9.1 04/15/2022    PROT 6.9 04/15/2022    LABALBU 4.1 04/15/2022    BILITOT 0.3 04/15/2022    ALKPHOS 49 04/15/2022    AST 16 04/15/2022    ALT 17 04/15/2022    LABGLOM >60 04/15/2022     No results found for: "CHOL"  No results found for: "TRIG"  No results found for: "HDL"  No results found for: "LDLCHOLESTEROL", "LDLCALC"  Lab Results   Component Value Date/Time    NA 139 04/15/2022 10:20 AM    K 4.0 04/15/2022 10:20 AM    CL 102 04/15/2022 10:20 AM    CO2 28 04/15/2022 10:20 AM    BUN 8 04/15/2022 10:20 AM    CREATININE 0.7 04/15/2022 10:20 AM    GLUCOSE 92 04/15/2022 10:20 AM    CALCIUM 9.1 04/15/2022 10:20 AM      Lab Results   Component Value  Date    WBC 6.2 04/15/2022    HGB 12.8 04/15/2022    HCT 37.1 04/15/2022    MCV 93.0 04/15/2022    PLT 155 04/15/2022    LYMPHOPCT 19.0 (L) 04/15/2022    RBC 3.99 (L) 04/15/2022    MCH 32.1 (H) 04/15/2022    MCHC 34.5 04/15/2022    RDW 11.6 04/15/2022     No results found for: "VITD25"  Labs reviewed from 04/15/2022  Diagnostics reviewed from 04/15/2022    Subjective:      Review of Systems   Constitutional:  Negative for fatigue, fever and unexpected weight change.   HENT:  Negative for ear discharge, ear pain, mouth sores, sore throat and trouble swallowing.    Eyes:  Negative for discharge, itching and visual disturbance.   Respiratory:  Negative for cough, choking, shortness of breath, wheezing and stridor.    Cardiovascular:  Negative for chest pain, palpitations and leg swelling.   Gastrointestinal:  Negative for abdominal distention, abdominal pain, blood in stool, constipation, diarrhea, nausea and vomiting.   Endocrine: Negative for cold intolerance, polydipsia and polyuria.   Genitourinary:  Positive for pelvic pain. Negative for difficulty urinating, dysuria, frequency and urgency.   Musculoskeletal:  Negative for arthralgias and gait problem.   Skin:  Negative for color change and rash.   Allergic/Immunologic: Negative for food allergies and immunocompromised state.   Neurological:  Negative for dizziness, tremors, syncope, speech difficulty, weakness and headaches.   Hematological:  Negative for adenopathy. Does not bruise/bleed easily.   Psychiatric/Behavioral:  Negative for confusion and hallucinations.  Objective:     Physical Exam  Constitutional:       General: She is not in acute distress.     Appearance: She is well-developed.   HENT:      Head: Normocephalic and atraumatic.   Eyes:      General: No scleral icterus.        Right eye: No discharge.         Left eye: No discharge.      Pupils: Pupils are equal, round, and reactive to light.   Neck:      Thyroid: No thyromegaly.      Vascular:  No JVD.   Cardiovascular:      Rate and Rhythm: Normal rate and regular rhythm.      Heart sounds: Normal heart sounds. No murmur heard.  Pulmonary:      Effort: Pulmonary effort is normal. No respiratory distress.      Breath sounds: Normal breath sounds. No wheezing or rales.   Abdominal:      General: Bowel sounds are normal. There is no distension.      Palpations: Abdomen is soft. There is no mass.      Tenderness: There is no abdominal tenderness. There is no guarding or rebound.   Musculoskeletal:         General: No tenderness. Normal range of motion.      Cervical back: Normal range of motion and neck supple.   Skin:     General: Skin is warm and dry.      Findings: No erythema or rash.   Neurological:      Mental Status: She is alert and oriented to person, place, and time.      Cranial Nerves: No cranial nerve deficit.      Coordination: Coordination normal.      Deep Tendon Reflexes: Reflexes are normal and symmetric. Reflexes normal.   Psychiatric:         Mood and Affect: Mood is not depressed.         Behavior: Behavior normal.         Thought Content: Thought content normal.         Judgment: Judgment normal.       BP 108/60   Pulse 90   Ht 5\' 7"  (1.702 m)   Wt 175 lb (79.4 kg)   SpO2 99%   BMI 27.41 kg/m           Assessment:      Problem List       Interstitial cystitis     Prn azo           PCOS (polycystic ovarian syndrome) - Primary    Relevant Medications    LO LOESTRIN FE 1 MG-10 MCG / 10 MCG tablet    Other Relevant Orders    [place] - , [place], NP, OB/GYN, [place]    PMDD (premenstrual dysphoric disorder)     zoloft and BCP's            Relevant Medications    sertraline (ZOLOFT) 50 MG tablet       Plan:        Patient given educational materials - see patient instructions.  Discussed use, benefit, and side effects of prescribed medications.  Allpatient questions answered.  Pt voiced understanding. Reviewed health maintenance.  Instructed to continue current medications, diet and  exercise.  Patient agreed with treatment plan. Follow up as directed.   MEDICATIONS:  Orders Placed This  Encounter   Medications    LO LOESTRIN FE 1 MG-10 MCG / 10 MCG tablet     Sig: Take 1 tablet by mouth daily     Dispense:  1 packet     Refill:  3         ORDERS:  Orders Placed This Encounter   Procedures    CBC with Auto Differential    Comprehensive Metabolic Panel    Hepatitis C Antibody    HIV Rapid 1&2    Vitamin D 25 Hydroxy    Urinalysis with Reflex to Culture    TSH    [place] - Nedra HaiLee, Lindie SpruceMeghan, NP, OB/GYN, [place]       Follow-up:  Return in about 6 months (around 11/09/2022) for have labs done prior to appt.    PATIENT INSTRUCTIONS:  Patient Instructions    Colitis resolved  PCOS;  refer to GYn   Interstitial cystitis  continue with as needed azo  PMDD;  stable with BCP;s and zoloft          @Life  simple 7  1) Manage blood pressure - high blood pressure is a major risk factor for heart disease and stroke. Keeping blood pressure in health range reduces strain on your heart, arteries and kidneys.  Blood pressure goal is less than 130/80.   2) Control cholesterol - contributes to plaque, which can clog arteries and lead to heart disease and stroke. When you control your cholesterol you are giving your arteries their best chance to remain clear.  It is recommended that you get cholesterol lab work done once a year.  3) Reduce blood sugar - most of the food we eat is turning into glucose or blood sugar that our body uses for energy.  Over time, high levels of blood sugar can damage your heart, kidneys, eyes and nerves.  4) Get active - living an active life is one of the most rewarding gifts you can give yourself and those you love.  Simply put, daily physical activity increases your length and quality of life. Strive to exercise 15 minutes most days of the week.  5)  Eat better - A healthy diet is one of your best weapons for fighting cardiovascular disease.  When you eat a heart healthy diet, you improve your  chances for feeling good and staying healthy for life.  6)  Lose weight - when you shed extra fat an unnecessary pounds, you reduce the burden on your hear, lungs, blood vessels and skeleton.  You give yourself the gift of active living, you lower your blood pressure and help yourself feel better.  7) Stop smoking - cigarette smokers have a higher risk of developing cardiovascular disease.  If  You smoke, quitting is the best thing you can do for your health.  Electronically signed by Natale LayPamela D Koal Eslinger, APRN on 05/11/2022 at 8:57 AM    @On  this date 05/11/2022 I have spent 35  minutes reviewing previous notes, test results and face to face with the patient discussing the diagnosis and importance of compliance with the treatment plan as well as documenting on the day of the visit.  @More  than 50% of the time was spent counseling and coordinating care   EMRDragon/transcription disclaimer:  Much of this encounter note is electronic transcription/translation of spoken language to printed texts.  The electronic translation of spoken language may be erroneous, or at times,nonsensical words or phrases may be inadvertently transcribed.  Although I have reviewed the note for such  errors, some may still exist.

## 2022-05-11 NOTE — Patient Instructions (Signed)
Colitis resolved  PCOS;  refer to GYn   Interstitial cystitis  continue with as needed azo  PMDD;  stable with BCP;s and zoloft

## 2022-05-11 NOTE — Assessment & Plan Note (Signed)
zoloft and BCP's

## 2022-06-24 ENCOUNTER — Ambulatory Visit: Admit: 2022-06-24 | Discharge: 2022-06-24 | Payer: PRIVATE HEALTH INSURANCE

## 2022-06-24 DIAGNOSIS — R102 Pelvic and perineal pain: Secondary | ICD-10-CM

## 2022-06-24 LAB — POCT URINE PREGNANCY: Preg Test, Ur: NEGATIVE

## 2022-06-24 NOTE — Progress Notes (Signed)
[facility]  [facility]  [address]  [facility] [place] [address]  Dept: [phone]  Dept Fax: [phone]  Loc: [phone]    Susan Walls is a 26 y.o. female who presents today for her medical conditions/complaints as noted below.  Susan Walls is complaining of Vaginal Pain (X 4 days  been taking Azo's /)        HPI:   Pt presents with c/o vaginal pain, vaginal itching/irritation, and dysuria x a few days. Denies blood in urine, frequency/urgency, abdominal pain, flank pain. Pt is sexually active. Denies known exposure to STDs. Pt has been taking AZO otc. S/s moderate. stable    Vaginal Pain  The patient's pertinent negatives include no pelvic pain. Associated symptoms include dysuria. Pertinent negatives include no abdominal pain, chills, constipation, diarrhea, fever, flank pain, frequency, headaches, hematuria, nausea, rash, sore throat, urgency or vomiting.       Past Medical History:   Diagnosis Date    Interstitial cystitis     Ovarian cyst        Past Surgical History:   Procedure Laterality Date    TONSILLECTOMY         History reviewed. No pertinent family history.    Social History     Tobacco Use    Smoking status: Never    Smokeless tobacco: Never   Substance Use Topics    Alcohol use: Yes        Current Outpatient Medications   Medication Sig Dispense Refill    LO LOESTRIN FE 1 MG-10 MCG / 10 MCG tablet Take 1 tablet by mouth daily 1 packet 3    ondansetron (ZOFRAN-ODT) 4 MG disintegrating tablet Take 1 tablet by mouth 3 times daily as needed for Nausea or Vomiting 21 tablet 0    sertraline (ZOLOFT) 50 MG tablet        No current facility-administered medications for this visit.       No Known Allergies    Health Maintenance   Topic Date Due    HIV screen  Never done    Hepatitis C screen  Never done    DTaP/Tdap/Td vaccine (1 - Tdap) Never done    Pap smear  Never done    HPV vaccine (1 - 2-dose series) 07/05/2022 (Originally 03/06/2008)     Hepatitis B vaccine (1 of 3 - 3-dose series) 05/12/2023 (Originally Mar 04, 1997)    Flu vaccine (1) 05/12/2023 (Originally 03/24/2022)    COVID-19 Vaccine (1) 05/08/2024 (Originally 09/06/1997)    Depression Monitoring  05/12/2023    Hepatitis A vaccine  Aged Out    Hib vaccine  Aged Out    Meningococcal (ACWY) vaccine  Aged Out    Pneumococcal 0-64 years Vaccine  Aged Out    Varicella vaccine  Discontinued    Depression Screen  Discontinued       Subjective:   Review of Systems   Constitutional:  Negative for activity change, appetite change, chills, diaphoresis, fatigue, fever and unexpected weight change.   HENT:  Negative for congestion, dental problem, ear discharge, ear pain, facial swelling, hearing loss, postnasal drip, sinus pressure, sinus pain, sore throat and trouble swallowing.    Eyes:  Negative for pain, discharge, redness and visual disturbance.   Respiratory:  Negative for apnea, cough, choking, chest tightness, shortness of breath, wheezing and stridor.    Cardiovascular:  Negative for chest pain and palpitations.   Gastrointestinal:  Negative for abdominal distention, abdominal pain,  constipation, diarrhea, nausea and vomiting.   Endocrine: Negative for polydipsia, polyphagia and polyuria.   Genitourinary:  Positive for dysuria and vaginal pain. Negative for difficulty urinating, enuresis, flank pain, frequency, hematuria, menstrual problem, pelvic pain and urgency.   Musculoskeletal:  Negative for arthralgias, joint swelling and myalgias.   Skin:  Negative for color change, pallor, rash and wound.   Allergic/Immunologic: Negative for immunocompromised state.   Neurological:  Negative for dizziness, seizures, syncope, numbness and headaches.   Hematological:  Negative for adenopathy. Does not bruise/bleed easily.   Psychiatric/Behavioral:  Negative for confusion and hallucinations.    All other systems reviewed and are negative.      Objective    Physical Exam  Vitals and nursing note reviewed.    Constitutional:       General: She is not in acute distress.     Appearance: Normal appearance. She is not ill-appearing, toxic-appearing or diaphoretic.   HENT:      Head: Normocephalic and atraumatic.      Mouth/Throat:      Mouth: Mucous membranes are moist.      Pharynx: Oropharynx is clear.   Eyes:      Extraocular Movements: Extraocular movements intact.      Conjunctiva/sclera: Conjunctivae normal.      Pupils: Pupils are equal, round, and reactive to light.   Cardiovascular:      Rate and Rhythm: Normal rate and regular rhythm.   Pulmonary:      Effort: Pulmonary effort is normal. No respiratory distress.      Breath sounds: No wheezing.   Musculoskeletal:         General: Normal range of motion.      Cervical back: Normal range of motion.   Skin:     General: Skin is warm.      Capillary Refill: Capillary refill takes less than 2 seconds.      Coloration: Skin is not jaundiced or pale.      Findings: No erythema or rash.   Neurological:      General: No focal deficit present.      Mental Status: She is alert and oriented to person, place, and time.      Deep Tendon Reflexes: Reflexes are normal and symmetric.   Psychiatric:         Attention and Perception: Attention normal.         Mood and Affect: Mood normal.         Behavior: Behavior normal. Behavior is cooperative.         Thought Content: Thought content normal.         BP 126/78   Pulse 75   Temp 97.9 F (36.6 C) (Temporal)   Resp 20   Ht 1.676 m (5\' 6" )   Wt 82.4 kg (181 lb 9.6 oz)   LMP 06/24/2022 (Exact Date)   SpO2 99%   BMI 29.31 kg/m     Assessment         Diagnosis Orders   1. Vaginal pain  Culture, Urine    Miscellaneous Sendout    POCT urine pregnancy      2. Vaginal itching  Culture, Urine    Miscellaneous Sendout    POCT urine pregnancy      3. Dysuria  Culture, Urine    Miscellaneous Sendout    POCT urine pregnancy          Plan   [employer] testing and urine culture sent to lab; will  call with results    No meds prescribed  today. Further treatment pending lab results    Abstain from sexual activity until labs return and treatment is complete.     Increase water intake    Avoid baths or hot tubs    The patient is to follow up with PCP/GYN or return to clinic if symptoms worsen/fail to improve.    Pt demonstrates understanding and agrees with treatment plan.    Orders Placed This Encounter   Procedures    Culture, Urine    Miscellaneous Sendout     Order Specific Question:   Specify Req. Test (1 Test/Order)     Answer:   diatherix    POCT urine pregnancy       Results for orders placed or performed in visit on 06/24/22   POCT urine pregnancy   Result Value Ref Range    Preg Test, Ur NEG     Control         No orders of the defined types were placed in this encounter.     New Prescriptions    No medications on file        Return if symptoms worsen or fail to improve.         Discussed use, benefits, and side effects of any prescribed medications. All patient questions were answered. Patient voiced understanding of care plan.   Patient was given educational materials - see patient instructions below.     Patient Instructions   [employer] testing and urine culture sent to lab; will call with results    No meds prescribed today. Further treatment pending lab results    Abstain from sexual activity until labs return and treatment is complete.     Increase water intake    Avoid baths or hot tubs    The patient is to follow up with PCP/GYN or return to clinic if symptoms worsen/fail to improve.      Electronically signed by Cain Saupe, APRN - CNP on 06/24/2022 at 5:15 PM

## 2022-06-24 NOTE — Patient Instructions (Signed)
[employer] testing and urine culture sent to lab; will call with results    No meds prescribed today. Further treatment pending lab results    Abstain from sexual activity until labs return and treatment is complete.     Increase water intake    Avoid baths or hot tubs    The patient is to follow up with PCP/GYN or return to clinic if symptoms worsen/fail to improve.

## 2022-06-26 ENCOUNTER — Encounter

## 2022-06-26 LAB — CULTURE, URINE: Urine Culture, Routine: <10000

## 2022-06-26 MED ORDER — FLUCONAZOLE 150 MG PO TABS
150 MG | ORAL_TABLET | Freq: Every day | ORAL | 0 refills | Status: DC
Start: 2022-06-26 — End: 2022-06-29

## 2022-06-29 ENCOUNTER — Encounter

## 2022-06-29 ENCOUNTER — Ambulatory Visit: Admit: 2022-06-29 | Discharge: 2022-06-29 | Payer: PRIVATE HEALTH INSURANCE | Attending: Family

## 2022-06-29 DIAGNOSIS — Z01419 Encounter for gynecological examination (general) (routine) without abnormal findings: Secondary | ICD-10-CM

## 2022-06-29 LAB — TSH: TSH: 1.4 u[IU]/mL (ref 0.270–4.200)

## 2022-06-29 LAB — HCG, QUANTITATIVE, PREGNANCY: hCG Quant: 0.1 m[IU]/mL (ref 0.0–5.3)

## 2022-06-29 LAB — PROLACTIN: Prolactin: 31.37 ng/mL — ABNORMAL HIGH (ref 4.79–23.30)

## 2022-06-29 LAB — FOLLICLE STIMULATING HORMONE: FSH: 4.7 m[IU]/mL

## 2022-06-29 LAB — TESTOSTERONE: Testosterone: 107.3 ng/dL — ABNORMAL HIGH (ref 8.4–48.1)

## 2022-06-29 LAB — T4, FREE: T4 Free: 1.16 ng/dL (ref 0.93–1.70)

## 2022-06-29 MED ORDER — FLUOXETINE HCL 10 MG PO CAPS
10 MG | ORAL_CAPSULE | Freq: Every day | ORAL | 3 refills | Status: DC
Start: 2022-06-29 — End: 2022-11-02

## 2022-06-29 NOTE — Progress Notes (Signed)
Susan Walls is a 27 y.o. female who presents today for her medical conditions/ complaints as noted below. Railey Glad is c/o of New Patient and Annual Exam        HPI  Patient presents today as a new patient for pap smear and breast exam.   She states last period was the first cycle she had in over a year. She also has c/o vaginal pain- states its very often, happens often but always after intercourse.   She would also like to be checked for PCOS. In past in [place] where she lived, had [place] that showed enlarged ovaries, one being 7x normal size. She was missing periods at that time, but labs were normal. Has been on ocp since.   In relationship. Her pain is daily. Denies constipation.     Last mammogram:  never  Last pap smear:  over a year  Contraception:  ocp  Gravida:  0  Para:  0  AB:  0  Last bone density:  never  Last colonoscopy: never  Patient's last menstrual period was 06/24/2022 (exact date).  No obstetric history on file.    Past Medical History:   Diagnosis Date    Interstitial cystitis     Ovarian cyst      Past Surgical History:   Procedure Laterality Date    TONSILLECTOMY       Family History   Problem Relation Age of Onset    Breast Cancer Maternal Grandmother 46     Social History     Tobacco Use    Smoking status: Never    Smokeless tobacco: Never   Substance Use Topics    Alcohol use: Yes       Current Outpatient Medications   Medication Sig Dispense Refill    FLUoxetine (PROZAC) 10 MG capsule Take 1 capsule by mouth daily 30 capsule 3    LO LOESTRIN FE 1 MG-10 MCG / 10 MCG tablet Take 1 tablet by mouth daily 1 packet 3     No current facility-administered medications for this visit.     No Known Allergies  Vitals:    06/29/22 1017   BP: 124/83   Pulse: 94     Body mass index is 28.25 kg/m.    Review of Systems   Constitutional: Negative.    HENT: Negative.     Eyes: Negative.    Respiratory: Negative.     Cardiovascular: Negative.    Gastrointestinal: Negative.    Endocrine: Negative.     Genitourinary:  Positive for dyspareunia, menstrual problem and pelvic pain. Negative for difficulty urinating, dysuria, enuresis, frequency, hematuria, urgency and vaginal discharge.   Musculoskeletal: Negative.    Skin: Negative.    Allergic/Immunologic: Negative.    Neurological: Negative.    Hematological: Negative.    Psychiatric/Behavioral: Negative.          Pmdd         Physical Exam  Vitals and nursing note reviewed.   Constitutional:       General: She is not in acute distress.     Appearance: She is well-developed. She is not diaphoretic.   HENT:      Head: Normocephalic and atraumatic.      Right Ear: External ear normal.      Left Ear: External ear normal.      Nose: Nose normal.      Mouth/Throat:      Mouth: Mucous membranes are moist.      Pharynx:  Oropharynx is clear.   Eyes:      General: Lids are normal.         Right eye: No discharge.         Left eye: No discharge.      Conjunctiva/sclera: Conjunctivae normal.      Pupils: Pupils are equal, round, and reactive to light.   Neck:      Thyroid: No thyroid mass or thyromegaly.      Trachea: No tracheal deviation.   Cardiovascular:      Rate and Rhythm: Normal rate and regular rhythm.      Heart sounds: Normal heart sounds. No murmur heard.  Pulmonary:      Effort: Pulmonary effort is normal.      Breath sounds: Normal breath sounds. No wheezing.   Chest:   Breasts:     Breasts are symmetrical.      Right: No inverted nipple, mass, nipple discharge, skin change or tenderness.      Left: No inverted nipple, mass, nipple discharge, skin change or tenderness.   Abdominal:      General: Bowel sounds are normal. There is no distension.      Palpations: Abdomen is soft. There is no mass.      Tenderness: There is no abdominal tenderness.      Hernia: There is no hernia in the left inguinal area or right inguinal area.   Genitourinary:     General: Normal vulva.      Labia:         Right: No rash, tenderness, lesion or injury.         Left: No rash,  tenderness, lesion or injury.       Urethra: No prolapse, urethral pain, urethral swelling or urethral lesion.      Vagina: Tenderness present. No vaginal discharge.      Cervix: Normal. Discharge: normal cervical mucosa.      Uterus: Normal. Tender. Not enlarged.       Adnexa:         Right: Tenderness present. No mass or fullness.          Left: Tenderness present. No mass or fullness.        Rectum: Normal. No mass, anal fissure or external hemorrhoid.      Comments: Pap collected for cervical cytology and sti screen; pain with insertion of speculum  Musculoskeletal:         General: No tenderness. Normal range of motion.      Cervical back: Normal range of motion and neck supple.   Lymphadenopathy:      Cervical:      Right cervical: No superficial, deep or posterior cervical adenopathy.     Left cervical: No superficial, deep or posterior cervical adenopathy.      Upper Body:      Right upper body: No supraclavicular, pectoral or epitrochlear adenopathy.      Left upper body: No supraclavicular, pectoral or epitrochlear adenopathy.      Lower Body: No right inguinal adenopathy. No left inguinal adenopathy.   Skin:     General: Skin is warm and dry.      Capillary Refill: Capillary refill takes 2 to 3 seconds.      Findings: No rash.   Neurological:      Mental Status: She is alert and oriented to person, place, and time. She is not disoriented.      Sensory: No sensory deficit.  Coordination: Coordination normal.      Gait: Gait normal.      Deep Tendon Reflexes: Reflexes are normal and symmetric.   Psychiatric:         Speech: Speech normal.         Behavior: Behavior normal.         Thought Content: Thought content normal.         Judgment: Judgment normal.           Diagnosis Orders   1. Well woman exam with routine gynecological exam        2. Screening for cervical cancer  PAP SMEAR      3. Oral contraceptive pill surveillance        4. Amenorrhea  TSH    T4, Free    Testosterone    Prolactin     Follicle Stimulating Hormone    HCG, Quantitative, Pregnancy      5. Pelvic pain  [place] NON OB TRANSVAGINAL    C.trachomatis, N.gonorrhoeae, T.vaginalis Molecular, Thin Prep      6. Dyspareunia due to medical condition in female  [place] NON OB TRANSVAGINAL    C.trachomatis, N.gonorrhoeae, T.vaginalis Molecular, Thin Prep      7. PMDD (premenstrual dysphoric disorder)  FLUoxetine (PROZAC) 10 MG capsule          MEDICATIONS:  Orders Placed This Encounter   Medications    FLUoxetine (PROZAC) 10 MG capsule     Sig: Take 1 capsule by mouth daily     Dispense:  30 capsule     Refill:  3       ORDERS:  Orders Placed This Encounter   Procedures    C.trachomatis, N.gonorrhoeae, T.vaginalis Molecular, Thin Prep    [place] NON OB TRANSVAGINAL    PAP SMEAR    TSH    T4, Free    Testosterone    Prolactin    Follicle Stimulating Hormone    HCG, Quantitative, Pregnancy       PLAN:  Pap and sti collected  Will order [place] for her pain  Find out family history for empower testing  Labs for her amenorrhea although reassurance likely d/t ocp  Pt agreeable to change from zoloft to prozac for pmdd.   Plan pending findings.   There are no Patient Instructions on file for this visit.

## 2022-06-30 LAB — C.TRACHOMATIS, N.GONORRHOEAE, T.VAGINALIS MOLECULAR, THIN PREP
C. trachomatis DNA,Thin Prep: NOT DETECTED
N. gonorrhoeae DNA, Thin Prep: NOT DETECTED
Trichomonas Vaginalis DNA: NOT DETECTED

## 2022-07-03 NOTE — Telephone Encounter (Signed)
Left message for patient regarding results    Per Sherri:  Please let pt know her testosterone is elevated, as it would be for pcos. I was surprised but it is.  Her prolactin is elevated slightly as well. I recommend we change her birth control pill to one that will help bring down her testosterone. OR we can keep her on current birth control and add spironolactone twice daily to her meds which can lower testosterone too  I want her to repeat prolactin in couple weeks or so early morning check before we discuss anymore on that.   Also I am ordering an ultrasound too since no period and testosterone is high and for her pain

## 2022-07-06 NOTE — Telephone Encounter (Signed)
LM for pt to call the office back per Sherri let pt know her pap and sti screen negative.

## 2022-07-07 NOTE — Telephone Encounter (Signed)
Left message for patient to reach out regarding results.

## 2022-07-21 MED ORDER — SPIRONOLACTONE 50 MG PO TABS
50 MG | ORAL_TABLET | Freq: Two times a day (BID) | ORAL | 0 refills | Status: AC
Start: 2022-07-21 — End: 2023-03-23

## 2022-08-30 ENCOUNTER — Encounter

## 2022-08-31 MED ORDER — LO LOESTRIN FE 1 MG-10 MCG / 10 MCG PO TABS
1 MG-0 MCG / 0 MCG | ORAL_TABLET | Freq: Every day | ORAL | 0 refills | Status: AC
Start: 2022-08-31 — End: 2022-09-28

## 2022-08-31 NOTE — Telephone Encounter (Signed)
Susan Walls called requesting a refill of the below medication which has been pended for you:     Requested Prescriptions     Pending Prescriptions Disp Refills    LO LOESTRIN FE 1 MG-10 MCG / 10 MCG tablet [Pharmacy Med Name: LO LOESTRIN FE TABLETS 28S] 28 tablet      Sig: TAKE 1 TABLET BY MOUTH DAILY       Last Appointment Date: 05/11/2022  Next Appointment Date: 11/09/2022    No Known Allergies

## 2022-09-27 ENCOUNTER — Encounter

## 2022-09-28 MED ORDER — LO LOESTRIN FE 1 MG-10 MCG / 10 MCG PO TABS
1 | ORAL_TABLET | Freq: Every day | ORAL | 0 refills | Status: DC
Start: 2022-09-28 — End: 2022-11-02

## 2022-09-28 NOTE — Telephone Encounter (Signed)
Iviana called requesting a refill of the below medication which has been pended for you:     Requested Prescriptions     Pending Prescriptions Disp Refills    LO LOESTRIN FE 1 MG-10 MCG / 10 MCG tablet [Pharmacy Med Name: LO LOESTRIN FE TABLETS 28S] 28 tablet 0     Sig: TAKE 1 TABLET BY MOUTH DAILY       Last Appointment Date: 05/11/2022  Next Appointment Date: 11/09/2022    No Known Allergies

## 2022-10-28 ENCOUNTER — Encounter

## 2022-10-28 NOTE — Telephone Encounter (Signed)
Last OV 05/11/2022  Next OV 11/09/2022      Requested Prescriptions     Pending Prescriptions Disp Refills    LO LOESTRIN FE 1 MG-10 MCG / 10 MCG tablet [Pharmacy Med Name: LO LOESTRIN FE TABLETS 28S] 28 tablet 0     Sig: TAKE 1 TABLET BY MOUTH DAILY

## 2022-11-02 ENCOUNTER — Encounter

## 2022-11-03 MED ORDER — LO LOESTRIN FE 1 MG-10 MCG / 10 MCG PO TABS
1-1010 MG-0 MCG / 0 MCG | ORAL_TABLET | Freq: Every day | ORAL | 0 refills | Status: AC
Start: 2022-11-03 — End: 2023-03-23

## 2022-11-03 MED ORDER — FLUOXETINE HCL 10 MG PO CAPS
10 MG | ORAL_CAPSULE | Freq: Every day | ORAL | 3 refills | Status: AC
Start: 2022-11-03 — End: 2023-03-23

## 2022-11-03 NOTE — Telephone Encounter (Signed)
Susan Walls called requesting a refill of the below medication which has been pended for you:     Requested Prescriptions     Pending Prescriptions Disp Refills    LO LOESTRIN FE 1 MG-10 MCG / 10 MCG tablet 28 tablet 0     Sig: Take 1 tablet by mouth daily       Last Appointment Date: 05/11/2022  Next Appointment Date: 11/09/2022    No Known Allergies

## 2022-11-09 ENCOUNTER — Encounter: Payer: PRIVATE HEALTH INSURANCE | Attending: Acute Care | Primary: Acute Care

## 2022-11-10 ENCOUNTER — Encounter: Payer: PRIVATE HEALTH INSURANCE | Attending: Acute Care | Primary: Acute Care

## 2022-11-12 ENCOUNTER — Encounter: Payer: PRIVATE HEALTH INSURANCE | Attending: Acute Care | Primary: Acute Care

## 2022-11-12 NOTE — Assessment & Plan Note (Deleted)
On spironalactone with GYN

## 2022-11-12 NOTE — Assessment & Plan Note (Deleted)
With GYN 

## 2022-11-12 NOTE — Assessment & Plan Note (Deleted)
On prozac with gyn

## 2022-11-12 NOTE — Progress Notes (Unsigned)
[facility] PHYSICIAN SERVICES  [facility] INTERNAL MEDICINE  [id number]  [place] [address]  Dept: [phone]  Dept Fax: [phone]  Loc: [phone]    Susan Walls (DOB:  07-06-1996) is a 27 y.o. female,Established patient  with Susan Walls, here for evaluation of the following chief complaint(s): No chief complaint on file.      Susan Walls is a 27 y.o. female who presents today for her medical conditions/complaints as noted below.  Susan Walls is c/oof No chief complaint on file.        HPI:   No chief complaint on file.    @pt  is alone   didn't get labs for today   HPI    PCOS;  on spironalactone;  with gyn   Interstitial cystitis   3.  Depression  with GYn       Past Medical History:   Diagnosis Date    Interstitial cystitis     Ovarian cyst       Past Surgical History:   Procedure Laterality Date    TONSILLECTOMY             06/29/2022    10:17 AM 06/24/2022     3:48 PM 05/11/2022     8:23 AM 04/15/2022     9:04 AM 04/14/2022    10:52 PM 04/10/2022     6:17 PM   Vitals   SYSTOLIC [id number]   DIASTOLIC 83 78 60 99 77 70   Pulse 94 75 90 85 103 75   Temp  97.9 F (36.6 C)  98.8 F (37.1 C) 97.7 F (36.5 C) 97.8 F (36.6 C)   Respirations  20  17 18 20    SpO2  99 % 99 % 100 % 98 % 99 %   Weight - Scale 175 lb 181 lb 9.6 oz 175 lb  175 lb 175 lb   Height  5\' 6"  5\' 7"       Body Mass Index  29.31 kg/m2 27.41 kg/m2          Family History   Problem Relation Age of Onset    Breast Cancer Maternal Grandmother 1       Social History     Tobacco Use    Smoking status: Never    Smokeless tobacco: Never   Substance Use Topics    Alcohol use: Yes      Current Outpatient Medications   Medication Sig Dispense Refill    LO LOESTRIN FE 1 MG-10 MCG / 10 MCG tablet Take 1 tablet by mouth daily 28 tablet 0    FLUoxetine (PROZAC) 10 MG capsule Take 1 capsule by mouth daily 30 capsule 3    spironolactone (ALDACTONE) 50 MG tablet Take 1 tablet by mouth 2 times daily 60 tablet 0     No current  facility-administered medications for this visit.     No Known Allergies    Health Maintenance   Topic Date Due    HPV vaccine (1 - 2-dose series) Never done    HIV screen  Never done    Hepatitis C screen  Never done    DTaP/Tdap/Td vaccine (1 - Tdap) Never done    Hepatitis B vaccine (1 of 3 - 3-dose series) 05/12/2023 (Originally 1996-07-19)    Flu vaccine (1) 05/12/2023 (Originally 03/24/2022)    COVID-19 Vaccine (1) 05/08/2024 (Originally 09/06/1996)    Depression Monitoring  05/12/2023    Pap smear  06/29/2025    Hepatitis A vaccine  Aged Out    Hib vaccine  Aged Out    Polio vaccine  Aged Out    Meningococcal (ACWY) vaccine  Aged Out    Pneumococcal 0-64 years Vaccine  Aged Out    Varicella vaccine  Discontinued    Depression Screen  Discontinued       No results found for: "LABA1C"  No results found for: "PSA", "PSADIA"  TSH   Date Value Ref Range Status   06/29/2022 1.400 0.270 - 4.200 uIU/mL Final   ]  Lab Results   Component Value Date    NA 139 04/15/2022    K 4.0 04/15/2022    CL 102 04/15/2022    CO2 28 04/15/2022    BUN 8 04/15/2022    CREATININE 0.7 04/15/2022    GLUCOSE 92 04/15/2022    CALCIUM 9.1 04/15/2022    PROT 6.9 04/15/2022    LABALBU 4.1 04/15/2022    BILITOT 0.3 04/15/2022    ALKPHOS 49 04/15/2022    AST 16 04/15/2022    ALT 17 04/15/2022    LABGLOM >60 04/15/2022     No results found for: "CHOL"  No results found for: "TRIG"  No results found for: "HDL"  No results found for: "LDLCHOLESTEROL", "LDLCALC"  Lab Results   Component Value Date/Time    NA 139 04/15/2022 10:20 AM    K 4.0 04/15/2022 10:20 AM    CL 102 04/15/2022 10:20 AM    CO2 28 04/15/2022 10:20 AM    BUN 8 04/15/2022 10:20 AM    CREATININE 0.7 04/15/2022 10:20 AM    GLUCOSE 92 04/15/2022 10:20 AM    CALCIUM 9.1 04/15/2022 10:20 AM      Lab Results   Component Value Date    WBC 6.2 04/15/2022    HGB 12.8 04/15/2022    HCT 37.1 04/15/2022    MCV 93.0 04/15/2022    PLT 155 04/15/2022    LYMPHOPCT 19.0 (L) 04/15/2022    RBC 3.99 (L)  04/15/2022    MCH 32.1 (H) 04/15/2022    MCHC 34.5 04/15/2022    RDW 11.6 04/15/2022     No results found for: "VITD25"  Labs reviewed from ***  Diagnostics reviewed from ***  @assessment  requiring independent historian ***@   Subjective:      Review of Systems   Constitutional:  Negative for fatigue, fever and unexpected weight change.   HENT:  Negative for ear discharge, ear pain, mouth sores, sore throat and trouble swallowing.    Eyes:  Negative for discharge, itching and visual disturbance.   Respiratory:  Negative for cough, choking, shortness of breath, wheezing and stridor.    Cardiovascular:  Negative for chest pain, palpitations and leg swelling.   Gastrointestinal:  Negative for abdominal distention, abdominal pain, blood in stool, constipation, diarrhea, nausea and vomiting.   Endocrine: Negative for cold intolerance, polydipsia and polyuria.   Genitourinary:  Positive for dysuria. Negative for difficulty urinating, frequency and urgency.   Musculoskeletal:  Negative for arthralgias and gait problem.   Skin:  Negative for color change and rash.   Allergic/Immunologic: Negative for food allergies and immunocompromised state.   Neurological:  Negative for dizziness, tremors, syncope, speech difficulty, weakness and headaches.   Hematological:  Negative for adenopathy. Does not bruise/bleed easily.   Psychiatric/Behavioral:  Positive for dysphoric mood. Negative for confusion and hallucinations.        Objective:     Physical Exam  Constitutional:       General: She is not in acute distress.     Appearance: She is well-developed.   HENT:      Head: Normocephalic and atraumatic.   Eyes:      General: No scleral icterus.        Right eye: No discharge.         Left eye: No discharge.      Pupils: Pupils are equal, round, and reactive to light.   Neck:      Thyroid: No thyromegaly.      Vascular: No JVD.   Cardiovascular:      Rate and Rhythm: Normal rate and regular rhythm.      Heart sounds: Normal heart  sounds. No murmur heard.  Pulmonary:      Effort: Pulmonary effort is normal. No respiratory distress.      Breath sounds: Normal breath sounds. No wheezing or rales.   Abdominal:      General: Bowel sounds are normal. There is no distension.      Palpations: Abdomen is soft. There is no mass.      Tenderness: There is no abdominal tenderness. There is no guarding or rebound.   Musculoskeletal:         General: No tenderness. Normal range of motion.      Cervical back: Normal range of motion and neck supple.   Skin:     General: Skin is warm and dry.      Findings: No erythema or rash.   Neurological:      Mental Status: She is alert and oriented to person, place, and time.      Cranial Nerves: No cranial nerve deficit.      Coordination: Coordination normal.      Deep Tendon Reflexes: Reflexes are normal and symmetric. Reflexes normal.   Psychiatric:         Mood and Affect: Mood is not depressed.         Behavior: Behavior normal.         Thought Content: Thought content normal.         Judgment: Judgment normal.       There were no vitals taken for this visit.          Assessment/Plan      Problem List       Depression     On prozac with gyn           Relevant Medications    FLUoxetine (PROZAC) 10 MG capsule    Interstitial cystitis    PCOS (polycystic ovarian syndrome) - Primary     On spironalactone with GYN           Relevant Medications    LO LOESTRIN FE 1 MG-10 MCG / 10 MCG tablet    PMDD (premenstrual dysphoric disorder)     With GYN           Relevant Medications    FLUoxetine (PROZAC) 10 MG capsule       Plan:        Patient given educational materials - see patient instructions.  Discussed use, benefit, and side effects of prescribed medications.  Allpatient questions answered.  Pt voiced understanding. Reviewed health maintenance.  Instructed to continue current medications, diet and exercise.  Patient agreed with treatment plan. Follow up as directed.   MEDICATIONS:  No orders of the defined types were  placed in this encounter.  ORDERS:  No orders of the defined types were placed in this encounter.      Follow-up:  No follow-ups on file.    There are no Patient Instructions on file for this visit.          @On  this date 11/12/2022 I have spent 22 minutes reviewing previous notes, test results and face to face with the patient discussing the diagnosis and importance of compliance with the treatment plan as well as documenting on the day of the visit.  @More  than 50% of the time was spent counseling and coordinating care   EMRDragon/transcription disclaimer:  Much of this encounter note is electronic transcription/translation of spoken language to printed texts.  The electronic translation of spoken language may be erroneous, or at times,nonsensical words or phrases may be inadvertently transcribed.  Although I have reviewed the note for such errors, some may still exist.

## 2022-11-29 ENCOUNTER — Encounter

## 2023-02-16 ENCOUNTER — Encounter

## 2023-03-02 ENCOUNTER — Encounter: Payer: PRIVATE HEALTH INSURANCE | Attending: Nurse Practitioner | Primary: Acute Care

## 2023-03-22 ENCOUNTER — Inpatient Hospital Stay: Admit: 2023-03-22 | Payer: PRIVATE HEALTH INSURANCE

## 2023-03-22 DIAGNOSIS — R102 Pelvic and perineal pain: Secondary | ICD-10-CM

## 2023-03-23 ENCOUNTER — Ambulatory Visit: Admit: 2023-03-23 | Discharge: 2023-03-23 | Payer: PRIVATE HEALTH INSURANCE | Attending: Family

## 2023-03-23 DIAGNOSIS — Z760 Encounter for issue of repeat prescription: Secondary | ICD-10-CM

## 2023-03-23 MED ORDER — SPIRONOLACTONE 50 MG PO TABS
50 | ORAL_TABLET | Freq: Two times a day (BID) | ORAL | 2 refills | Status: AC
Start: 2023-03-23 — End: ?

## 2023-03-23 MED ORDER — LO LOESTRIN FE 1 MG-10 MCG / 10 MCG PO TABS
1 MG-0 MCG / 0 MCG | PACK | Freq: Every day | ORAL | 2 refills | Status: DC
Start: 2023-03-23 — End: 2023-09-19

## 2023-03-23 MED ORDER — FLUOXETINE HCL 10 MG PO CAPS
10 MG | ORAL_CAPSULE | Freq: Every day | ORAL | 2 refills | Status: DC
Start: 2023-03-23 — End: 2023-09-19

## 2023-03-23 NOTE — Progress Notes (Signed)
Susan Walls is a 27 y.o. female who presents today for her medical conditions/ complaints as noted below. Tye Trenholm is c/o of Follow-up        HPI  Pt presents today for follow up on Prozac and Spironolactone. Pt was last seen in office 07/21/2022; was started on Spironolactone for elevated testosterone levels and pt wanted to stay on current OCP (LoLoestrin). Pt was also started on Prozac, 10 MG. Pt states she likes the prozac and it is working well; pt doesn't feel like she needs a dose increase at this time.   Is changing her insurances at work, requests 3 months refills.     Patient's last menstrual period was 03/21/2023 (exact date).  No obstetric history on file.    Past Medical History:   Diagnosis Date    Interstitial cystitis     Ovarian cyst      Past Surgical History:   Procedure Laterality Date    TONSILLECTOMY       Family History   Problem Relation Age of Onset    Breast Cancer Maternal Grandmother 28     Social History     Tobacco Use    Smoking status: Never    Smokeless tobacco: Never   Substance Use Topics    Alcohol use: Yes       Current Outpatient Medications   Medication Sig Dispense Refill    FLUoxetine (PROZAC) 10 MG capsule Take 1 capsule by mouth daily 90 capsule 2    LO LOESTRIN FE 1 MG-10 MCG / 10 MCG tablet Take 1 tablet by mouth daily 3 packet 2    spironolactone (ALDACTONE) 50 MG tablet Take 1 tablet by mouth 2 times daily 180 tablet 2     No current facility-administered medications for this visit.     No Known Allergies  Vitals:    03/23/23 1101   BP: 123/86   Pulse: 84     Body mass index is 30.32 kg/m.    Review of Systems   Constitutional: Negative.    HENT: Negative.     Eyes: Negative.    Respiratory: Negative.     Cardiovascular: Negative.    Gastrointestinal: Negative.    Endocrine: Negative.    Genitourinary: Negative.  Negative for difficulty urinating, dyspareunia, dysuria, enuresis, frequency, hematuria, menstrual problem, pelvic pain, urgency and vaginal discharge.    Musculoskeletal: Negative.    Skin: Negative.    Allergic/Immunologic: Negative.    Neurological: Negative.    Hematological: Negative.    Psychiatric/Behavioral: Negative.           Physical Exam  Vitals and nursing note reviewed.   Constitutional:       General: She is not in acute distress.     Appearance: She is well-developed. She is not diaphoretic.   HENT:      Head: Normocephalic and atraumatic.   Eyes:      Conjunctiva/sclera: Conjunctivae normal.      Pupils: Pupils are equal, round, and reactive to light.   Pulmonary:      Effort: Pulmonary effort is normal.   Abdominal:      Tenderness: There is no guarding.   Musculoskeletal:         General: Normal range of motion.      Cervical back: Normal range of motion.      Comments: Normal ROM in all 4 extremities; normal gait   Skin:     General: Skin is warm and dry.  Neurological:      Mental Status: She is alert and oriented to person, place, and time.      Motor: No abnormal muscle tone.      Coordination: Coordination normal.   Psychiatric:         Behavior: Behavior normal.           Diagnosis Orders   1. Medication refill  FLUoxetine (PROZAC) 10 MG capsule    LO LOESTRIN FE 1 MG-10 MCG / 10 MCG tablet    spironolactone (ALDACTONE) 50 MG tablet      2. PMDD (premenstrual dysphoric disorder)  FLUoxetine (PROZAC) 10 MG capsule      3. PCOS (polycystic ovarian syndrome)  spironolactone (ALDACTONE) 50 MG tablet      4. Encounter for surveillance of contraceptive pills  LO LOESTRIN FE 1 MG-10 MCG / 10 MCG tablet          MEDICATIONS:  Orders Placed This Encounter   Medications    FLUoxetine (PROZAC) 10 MG capsule     Sig: Take 1 capsule by mouth daily     Dispense:  90 capsule     Refill:  2    LO LOESTRIN FE 1 MG-10 MCG / 10 MCG tablet     Sig: Take 1 tablet by mouth daily     Dispense:  3 packet     Refill:  2    spironolactone (ALDACTONE) 50 MG tablet     Sig: Take 1 tablet by mouth 2 times daily     Dispense:  180 tablet     Refill:  2       ORDERS:  No  orders of the defined types were placed in this encounter.      PLAN:  Refills made  Rtc for physical November/first of year or prn problems sooner  Over 50% of the total visit time of 30 minutes was spent on counseling and/or coordination of care. All questions were answered and the patient voiced understanding.    There are no Patient Instructions on file for this visit.

## 2023-09-19 ENCOUNTER — Encounter

## 2023-09-20 MED ORDER — FLUOXETINE HCL 10 MG PO CAPS
10 MG | ORAL_CAPSULE | Freq: Every day | ORAL | 1 refills | Status: DC
Start: 2023-09-20 — End: 2024-01-23

## 2023-09-20 MED ORDER — LO LOESTRIN FE 1 MG-10 MCG / 10 MCG PO TABS
1 MG-0 MCG / 0 MCG | PACK | Freq: Every day | ORAL | 1 refills | Status: DC
Start: 2023-09-20 — End: 2024-01-23

## 2024-01-23 ENCOUNTER — Encounter

## 2024-01-24 MED ORDER — FLUOXETINE HCL 10 MG PO CAPS
10 | ORAL_CAPSULE | Freq: Every day | ORAL | 0 refills | 90.00 days | Status: AC
Start: 2024-01-24 — End: ?

## 2024-01-24 MED ORDER — LO LOESTRIN FE 1 MG-10 MCG / 10 MCG PO TABS
1 | PACK | Freq: Every day | ORAL | 0 refills | 28.00 days | Status: AC
Start: 2024-01-24 — End: ?

## 2024-01-24 NOTE — Telephone Encounter (Signed)
 Called patient scheduled well woman appointment.

## 2024-02-09 ENCOUNTER — Encounter: Payer: PRIVATE HEALTH INSURANCE | Attending: Family

## 2024-04-27 ENCOUNTER — Ambulatory Visit: Admit: 2024-04-27 | Discharge: 2024-04-27

## 2024-04-27 VITALS — BP 118/70 | HR 77 | Temp 98.70000°F | Resp 18 | Wt 206.0 lb

## 2024-04-27 DIAGNOSIS — Z0189 Encounter for other specified special examinations: Principal | ICD-10-CM

## 2024-04-27 LAB — CBC WITH AUTO DIFFERENTIAL
Basophils %: 0.5 % (ref 0.0–1.0)
Basophils Absolute: 0 K/uL (ref 0.00–0.20)
Eosinophils %: 3.8 % (ref 0.0–5.0)
Eosinophils Absolute: 0.3 K/uL (ref 0.00–0.60)
Hematocrit: 34.2 % — ABNORMAL LOW (ref 37.0–47.0)
Hemoglobin: 11.8 g/dL — ABNORMAL LOW (ref 12.0–16.0)
Immature Granulocytes #: 0 K/uL
Lymphocytes %: 29.1 % (ref 20.0–40.0)
Lymphocytes Absolute: 2.3 K/uL (ref 1.1–4.5)
MCH: 32.1 pg — ABNORMAL HIGH (ref 27.0–31.0)
MCHC: 34.5 g/dL (ref 33.0–37.0)
MCV: 92.9 fL (ref 81.0–99.0)
MPV: 10.5 fL (ref 9.4–12.3)
Monocytes %: 5.7 % (ref 0.0–10.0)
Monocytes Absolute: 0.5 K/uL (ref 0.00–0.90)
Neutrophils %: 60.6 % (ref 50.0–65.0)
Neutrophils Absolute: 4.8 K/uL (ref 1.5–7.5)
Platelets: 196 K/uL (ref 130–400)
RBC: 3.68 M/uL — ABNORMAL LOW (ref 4.20–5.40)
RDW: 11.8 % (ref 11.5–14.5)
WBC: 7.9 K/uL (ref 4.8–10.8)

## 2024-04-27 NOTE — Progress Notes (Signed)
 [facility] ([place])  [facility]  [address]  [facility] [place] [address]  Dept: [phone]  Dept Fax: [phone]    Susan Walls is a 28 y.o. female who presents today for her medical conditions/complaints as noted below.  Susan Walls is c/o of lab work (Lab work need for plasma donation. )        HPI:     Susan Walls presents today for lab work. 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 She is concerned because she has a family history of cancer and they are currently do a work up on her brother to ensure he doesn't have cancer. No other symptoms. She is stable at this time.     Past Medical History:   Diagnosis Date    Interstitial cystitis     Ovarian cyst      Past Surgical History:   Procedure Laterality Date    TONSILLECTOMY         Family History   Problem Relation Age of Onset    Breast Cancer Maternal Grandmother 9       Social History     Tobacco Use    Smoking status: Never    Smokeless tobacco: Never   Substance Use Topics    Alcohol use: Yes      Current Outpatient Medications   Medication Sig Dispense Refill    FLUoxetine  (PROZAC ) 10 MG capsule Take 1 capsule by mouth daily 90 capsule 0    LO LOESTRIN FE  1 MG-10 MCG / 10 MCG tablet Take 1 tablet by mouth daily 3 packet 0    spironolactone  (ALDACTONE ) 50 MG tablet Take 1 tablet by mouth 2 times daily 180 tablet 2     No current facility-administered medications for this visit.     No Known Allergies    Health Maintenance   Topic Date Due    HIV screen  Never done    Hepatitis C screen  Never done    Hepatitis B vaccine (1 of 3 - 19+ 3-dose series) Never done    DTaP/Tdap/Td vaccine (1 - Tdap) Never done    Depression Monitoring  05/12/2023    Flu vaccine (1) Never done    COVID-19 Vaccine (1 - 2024-25 season) Never done    Pap smear  06/29/2025    HPV vaccine (No Doses Required) Completed     Hepatitis A vaccine  Aged Out    Hib vaccine  Aged Out    Polio vaccine  Aged Out    Meningococcal (ACWY) vaccine  Aged Out    Meningococcal B vaccine  Aged Out    Pneumococcal 0-49 years Vaccine  Aged Out    Varicella vaccine  Discontinued    Depression Screen  Discontinued       Subjective:     Review of Systems   All other systems reviewed and are negative.      :Objective      Physical Exam  Vitals reviewed.   Constitutional:       Appearance: Normal appearance.   HENT:      Head: Normocephalic.      Right Ear: External ear normal.      Left Ear: External ear normal.      Nose: Nose normal.      Mouth/Throat:  Mouth: Mucous membranes are moist.   Eyes:      Extraocular Movements: Extraocular movements intact.      Conjunctiva/sclera: Conjunctivae normal.      Pupils: Pupils are equal, round, and reactive to light.   Cardiovascular:      Rate and Rhythm: Normal rate and regular rhythm.      Heart sounds: Normal heart sounds.   Pulmonary:      Effort: Pulmonary effort is normal.      Breath sounds: Normal breath sounds.   Musculoskeletal:         General: Normal range of motion.      Cervical back: Normal range of motion.   Skin:     General: Skin is warm and dry.   Neurological:      General: No focal deficit present.      Mental Status: She is alert and oriented to person, place, and time.   Psychiatric:         Mood and Affect: Mood normal.         Behavior: Behavior normal.       BP 118/70   Pulse 77   Temp 98.7 F (37.1 C) (Temporal)   Resp 18   Wt 93.4 kg (206 lb)   LMP  (LMP Unknown)   SpO2 98%   BMI 32.26 kg/m     :Assessment   Assessment & Plan    Diagnosis Orders   1. Routine lab draw  CBC with Auto Differential          :Plan     - CBC is pending; will call when results are available.   - Exam and Vitals WNL.  - If CBC is abnormal; will consider hematology referral for further evaluation.   - Highly recommend that the patient get a primary care provider.  - Return to the clinic or follow  up with PCP if symptoms worsen or fail to improve.     Patient provided educational materials- see patient instructions.  Discussed administration, benefit, and side effects of any prescribed or OTC medications.  All patient questions answered appropriately.  Patient voiced understanding.     Return if symptoms worsen or fail to improve.    Urgent Care evaluation today is not a substitute for PCP visit. Follow up care is the responsibility of the patient to discuss and review this Urgent Care visit.    Orders Placed This Encounter   Procedures    CBC with Auto Differential       No results found for this visit on 04/27/24.    No orders of the defined types were placed in this encounter.       Patient Instructions     - CBC is pending; will call when results are available.   - Exam and Vitals WNL.  - If CBC is abnormal; will consider hematology referral for further evaluation.   - Highly recommend that the patient get a primary care provider.  - Return to the clinic or follow up with PCP if symptoms worsen or fail to improve.        Electronically signed by Lum KATHEE Lunger, APRN - CNP on 04/27/2024 at 4:51 PM

## 2024-04-27 NOTE — Patient Instructions (Signed)
-   CBC is pending; will call when results are available.   - Exam and Vitals WNL.  - If CBC is abnormal; will consider hematology referral for further evaluation.   - Highly recommend that the patient get a primary care provider.  - Return to the clinic or follow up with PCP if symptoms worsen or fail to improve.
